# Patient Record
Sex: Female | Born: 1980
Health system: Southern US, Community
[De-identification: ages and names within clinical notes are randomized; demographics above are authoritative.]

## PROBLEM LIST (undated history)

## (undated) HISTORY — PX: AUGMENTATION MAMMAPLASTY: SUR837

---

## 2004-05-03 HISTORY — PX: PLACEMENT OF BREAST IMPLANTS: SHX6334

## 2018-07-18 ENCOUNTER — Ambulatory Visit: Payer: Self-pay | Admitting: Internal Medicine

## 2018-07-25 ENCOUNTER — Ambulatory Visit: Payer: BLUE CROSS/BLUE SHIELD | Admitting: Internal Medicine

## 2018-07-25 ENCOUNTER — Encounter: Payer: Self-pay | Admitting: Internal Medicine

## 2018-07-25 VITALS — BP 120/80 | HR 66 | Temp 98.1°F | Ht 68.0 in | Wt 161.4 lb

## 2018-07-25 DIAGNOSIS — R51 Headache: Secondary | ICD-10-CM | POA: Diagnosis not present

## 2018-07-25 DIAGNOSIS — Z8249 Family history of ischemic heart disease and other diseases of the circulatory system: Secondary | ICD-10-CM

## 2018-07-25 DIAGNOSIS — Z0000 Encounter for general adult medical examination without abnormal findings: Secondary | ICD-10-CM

## 2018-07-25 DIAGNOSIS — R519 Headache, unspecified: Secondary | ICD-10-CM

## 2018-07-25 DIAGNOSIS — H66002 Acute suppurative otitis media without spontaneous rupture of ear drum, left ear: Secondary | ICD-10-CM | POA: Diagnosis not present

## 2018-07-25 MED ORDER — AMOXICILLIN-POT CLAVULANATE 875-125 MG PO TABS: 1.0000 | ORAL_TABLET | Freq: Two times a day (BID) | ORAL | 0 refills | Status: AC

## 2018-07-25 NOTE — Patient Instructions (Addendum)
-Nice meeting you today!  -Schedule a lab appointment for next week. Come in fasting.  -Start taking augmentin 1 tablet twice daily for 7 days.  -follow up with GYN.  -Schedule mammogram with GYN.  -Make sure you have routine eye and dental care.  -Schedule return visit in 1 year for physical or as needed.   Otitis Media, Adult  Otitis media occurs when there is inflammation and fluid in the middle ear. Your middle ear is a part of the ear that contains bones for hearing as well as air that helps send sounds to your brain. What are the causes? This condition is caused by a blockage in the eustachian tube. This tube drains fluid from the ear to the back of the nose (nasopharynx). A blockage in this tube can be caused by an object or by swelling (edema) in the tube. Problems that can cause a blockage include:  A cold or other upper respiratory infection.  Allergies.  An irritant, such as tobacco smoke.  Enlarged adenoids. The adenoids are areas of soft tissue located high in the back of the throat, behind the nose and the roof of the mouth.  A mass in the nasopharynx.  Damage to the ear caused by pressure changes (barotrauma). What are the signs or symptoms? Symptoms of this condition include:  Ear pain.  A fever.  Decreased hearing.  A headache.  Tiredness (lethargy).  Fluid leaking from the ear.  Ringing in the ear. How is this diagnosed? This condition is diagnosed with a physical exam. During the exam your health care provider will use an instrument called an otoscope to look into your ear and check for redness, swelling, and fluid. He or she will also ask about your symptoms. Your health care provider may also order tests, such as:  A test to check the movement of the eardrum (pneumatic otoscopy). This test is done by squeezing a small amount of air into the ear.  A test that changes air pressure in the middle ear to check how well the eardrum moves and  whether the eustachian tube is working (tympanogram). How is this treated? This condition usually goes away on its own within 3-5 days. But if the condition is caused by a bacteria infection and does not go away own its own, or keeps coming back, your health care provider may:  Prescribe antibiotic medicines to treat the infection.  Prescribe or recommend medicines to control pain. Follow these instructions at home:  Take over-the-counter and prescription medicines only as told by your health care provider.  If you were prescribed an antibiotic medicine, take it as told by your health care provider. Do not stop taking the antibiotic even if you start to feel better.  Keep all follow-up visits as told by your health care provider. This is important. Contact a health care provider if:  You have bleeding from your nose.  There is a lump on your neck.  You are not getting better in 5 days.  You feel worse instead of better. Get help right away if:  You have severe pain that is not controlled with medicine.  You have swelling, redness, or pain around your ear.  You have stiffness in your neck.  A part of your face is paralyzed.  The bone behind your ear (mastoid) is tender when you touch it.  You develop a severe headache. Summary  Otitis media is redness, soreness, and swelling of the middle ear.  This condition usually goes  away on its own within 3-5 days.  If the problem does not go away in 3-5 days, your health care provider may prescribe or recommend medicines to treat your symptoms.  If you were prescribed an antibiotic medicine, take it as told by your health care provider. This information is not intended to replace advice given to you by your health care provider. Make sure you discuss any questions you have with your health care provider. Document Released: 03/02/2004 Document Revised: 05/18/2016 Document Reviewed: 05/18/2016   Preventive Care 18-39 Years,  Female Preventive care refers to lifestyle choices and visits with your health care provider that can promote health and wellness. What does preventive care include?   A yearly physical exam. This is also called an annual well check.  Dental exams once or twice a year.  Routine eye exams. Ask your health care provider how often you should have your eyes checked.  Personal lifestyle choices, including: ? Daily care of your teeth and gums. ? Regular physical activity. ? Eating a healthy diet. ? Avoiding tobacco and drug use. ? Limiting alcohol use. ? Practicing safe sex. ? Taking vitamin and mineral supplements as recommended by your health care provider. What happens during an annual well check? The services and screenings done by your health care provider during your annual well check will depend on your age, overall health, lifestyle risk factors, and family history of disease. Counseling Your health care provider may ask you questions about your:  Alcohol use.  Tobacco use.  Drug use.  Emotional well-being.  Home and relationship well-being.  Sexual activity.  Eating habits.  Work and work Statistician.  Method of birth control.  Menstrual cycle.  Pregnancy history. Screening You may have the following tests or measurements:  Height, weight, and BMI.  Diabetes screening. This is done by checking your blood sugar (glucose) after you have not eaten for a while (fasting).  Blood pressure.  Lipid and cholesterol levels. These may be checked every 5 years starting at age 84.  Skin check.  Hepatitis C blood test.  Hepatitis B blood test.  Sexually transmitted disease (STD) testing.  BRCA-related cancer screening. This may be done if you have a family history of breast, ovarian, tubal, or peritoneal cancers.  Pelvic exam and Pap test. This may be done every 3 years starting at age 63. Starting at age 34, this may be done every 5 years if you have a Pap test  in combination with an HPV test. Discuss your test results, treatment options, and if necessary, the need for more tests with your health care provider. Vaccines Your health care provider may recommend certain vaccines, such as:  Influenza vaccine. This is recommended every year.  Tetanus, diphtheria, and acellular pertussis (Tdap, Td) vaccine. You may need a Td booster every 10 years.  Varicella vaccine. You may need this if you have not been vaccinated.  HPV vaccine. If you are 40 or younger, you may need three doses over 6 months.  Measles, mumps, and rubella (MMR) vaccine. You may need at least one dose of MMR. You may also need a second dose.  Pneumococcal 13-valent conjugate (PCV13) vaccine. You may need this if you have certain conditions and were not previously vaccinated.  Pneumococcal polysaccharide (PPSV23) vaccine. You may need one or two doses if you smoke cigarettes or if you have certain conditions.  Meningococcal vaccine. One dose is recommended if you are age 19-21 years and a first-year college student living in a  residence hall, or if you have one of several medical conditions. You may also need additional booster doses.  Hepatitis A vaccine. You may need this if you have certain conditions or if you travel or work in places where you may be exposed to hepatitis A.  Hepatitis B vaccine. You may need this if you have certain conditions or if you travel or work in places where you may be exposed to hepatitis B.  Haemophilus influenzae type b (Hib) vaccine. You may need this if you have certain risk factors. Talk to your health care provider about which screenings and vaccines you need and how often you need them. This information is not intended to replace advice given to you by your health care provider. Make sure you discuss any questions you have with your health care provider. Document Released: 07/24/2001 Document Revised: 01/08/2017 Document Reviewed:  03/29/2015 Elsevier Interactive Patient Education  2019 [employer].  Chartered certified accountant Patient Education  [employer].

## 2018-07-25 NOTE — Progress Notes (Signed)
New Patient Office Visit     CC/Reason for Visit: Establish care, headache, left ear pain  HPI: Mary Petersen is a 38 y.o. female who is coming in today for the above mentioned reasons.  She has no past medical history of significance.  Over the weekend she had the stomach flu with nausea and vomiting.  Subsequently developed headache, has been having left ear fullness since then.  No fevers, has had chills, no sick contacts, no recent travel.  She works at a [employer] risk [employer].  She has routine dental care, has not seen an eye doctor recently.  Mother has breast cancer and also melanoma.  She has never had a mammogram but is scheduled to see GYN next month for the first time.  She is not interested in receiving flu vaccination today, is considering getting tetanus.   Past Medical/Surgical History: History reviewed. No pertinent past medical history.  History reviewed. No pertinent surgical history.  Social History:  reports that she has never smoked. She has never used smokeless tobacco. She reports current alcohol use. She reports that she does not use drugs.  Allergies: Allergies  Allergen Reactions  . Sulfa Antibiotics     Swelling with hives    Family History:  Family History  Problem Relation Age of Onset  . Breast cancer Mother   . Melanoma Mother      Current Outpatient Medications:  .  amoxicillin-clavulanate (AUGMENTIN) 875-125 MG tablet, Take 1 tablet by mouth 2 (two) times daily for 7 days., Disp: 14 tablet, Rfl: 0  Review of Systems:  Constitutional: diaphoresis, appetite change and fatigue.  HEENT: Denies photophobia, eye pain, redness, hearing loss, ear pain, congestion, sore throat, rhinorrhea, sneezing, mouth sores, trouble swallowing, neck pain, neck stiffness and tinnitus.   Respiratory: Denies SOB, DOE, cough, chest tightness,  and wheezing.   Cardiovascular: Denies chest pain, palpitations and leg swelling.  Gastrointestinal:  Denies nausea, vomiting, abdominal pain, diarrhea, constipation, blood in stool and abdominal distention.  Genitourinary: Denies dysuria, urgency, frequency, hematuria, flank pain and difficulty urinating.  Endocrine: Denies: hot or cold intolerance, sweats, changes in hair or nails, polyuria, polydipsia. Musculoskeletal: Denies myalgias, back pain, joint swelling, arthralgias and gait problem.  Skin: Denies pallor, rash and wound.  Neurological: Denies dizziness, seizures, syncope, weakness, light-headedness, numbness and headaches.  Hematological: Denies adenopathy. Easy bruising, personal or family bleeding history  Psychiatric/Behavioral: Denies suicidal ideation, mood changes, confusion, nervousness, sleep disturbance and agitation    Physical Exam: Vitals:   07/25/18 1347  BP: 120/80  Pulse: 66  Temp: 98.1 F (36.7 C)  TempSrc: Oral  SpO2: 98%  Weight: 161 lb 6.4 oz (73.2 kg)  Height: _0  (1.727 m)    Body mass index is 24.54 kg/m.   Constitutional: NAD, calm, comfortable Eyes: PERRL, lids and conjunctivae normal ENMT: Mucous membranes are moist. Posterior pharynx clear of any exudate or lesions. Normal dentition. Tympanic membrane is pearly white, no erythema or bulging on the right, on the left tympanic membrane is bulging, erythematous with air-fluid levels. Neck: normal, supple, no masses, no thyromegaly Respiratory: clear to auscultation bilaterally, no wheezing, no crackles. Normal respiratory effort. No accessory muscle use.  Cardiovascular: Regular rate and rhythm, no murmurs / rubs / gallops. No extremity edema. 2+ pedal pulses. No carotid bruits.  Abdomen: no tenderness, no masses palpated. No hepatosplenomegaly. Bowel sounds positive.  Musculoskeletal: no clubbing / cyanosis. No joint deformity upper and lower extremities. Good ROM, no contractures. Normal muscle  tone.  Skin: no rashes, lesions, ulcers. No induration Neurologic: CN 2-12 grossly intact.  Sensation intact, DTR normal. Strength 5/5 in all 4.  Psychiatric: Normal judgment and insight. Alert and oriented x 3. Normal mood.    Impression and Plan:  Non-recurrent acute suppurative otitis media of left ear without spontaneous rupture of tympanic membrane  Sinus headache -Likely has had a recent upper respiratory that has culminated in a left ear infection.  Given findings on exam, will treat as bacterial with Augmentin for 7 days. -She is instructed to return to clinic if no improvement within 10 to 14 days.  Encounter for preventive health examination -We will be seeing GYN soon for cervical cancer screening, have recommended mammogram given for mother with breast cancer. -Have advised routine eye and dental care. -She refuses flu vaccine, will think about tetanus vaccination, she does not think she has gotten it in the past 10 years. -Lab work today.     Patient Instructions  -Nice meeting you today!  -Schedule a lab appointment for next week. Come in fasting.  -Start taking augmentin 1 tablet twice daily for 7 days.  -follow up with GYN.  -Schedule mammogram with GYN.  -Make sure you have routine eye and dental care.  -Schedule return visit in 1 year for physical or as needed.   Otitis Media, Adult  Otitis media occurs when there is inflammation and fluid in the middle ear. Your middle ear is a part of the ear that contains bones for hearing as well as air that helps send sounds to your brain. What are the causes? This condition is caused by a blockage in the eustachian tube. This tube drains fluid from the ear to the back of the nose (nasopharynx). A blockage in this tube can be caused by an object or by swelling (edema) in the tube. Problems that can cause a blockage include:  A cold or other upper respiratory infection.  Allergies.  An irritant, such as tobacco smoke.  Enlarged adenoids. The adenoids are areas of soft tissue located high in the back of  the throat, behind the nose and the roof of the mouth.  A mass in the nasopharynx.  Damage to the ear caused by pressure changes (barotrauma). What are the signs or symptoms? Symptoms of this condition include:  Ear pain.  A fever.  Decreased hearing.  A headache.  Tiredness (lethargy).  Fluid leaking from the ear.  Ringing in the ear. How is this diagnosed? This condition is diagnosed with a physical exam. During the exam your health care provider will use an instrument called an otoscope to look into your ear and check for redness, swelling, and fluid. He or she will also ask about your symptoms. Your health care provider may also order tests, such as:  A test to check the movement of the eardrum (pneumatic otoscopy). This test is done by squeezing a small amount of air into the ear.  A test that changes air pressure in the middle ear to check how well the eardrum moves and whether the eustachian tube is working (tympanogram). How is this treated? This condition usually goes away on its own within 3-5 days. But if the condition is caused by a bacteria infection and does not go away own its own, or keeps coming back, your health care provider may:  Prescribe antibiotic medicines to treat the infection.  Prescribe or recommend medicines to control pain. Follow these instructions at home:  Take over-the-counter and prescription  medicines only as told by your health care provider.  If you were prescribed an antibiotic medicine, take it as told by your health care provider. Do not stop taking the antibiotic even if you start to feel better.  Keep all follow-up visits as told by your health care provider. This is important. Contact a health care provider if:  You have bleeding from your nose.  There is a lump on your neck.  You are not getting better in 5 days.  You feel worse instead of better. Get help right away if:  You have severe pain that is not controlled with  medicine.  You have swelling, redness, or pain around your ear.  You have stiffness in your neck.  A part of your face is paralyzed.  The bone behind your ear (mastoid) is tender when you touch it.  You develop a severe headache. Summary  Otitis media is redness, soreness, and swelling of the middle ear.  This condition usually goes away on its own within 3-5 days.  If the problem does not go away in 3-5 days, your health care provider may prescribe or recommend medicines to treat your symptoms.  If you were prescribed an antibiotic medicine, take it as told by your health care provider. This information is not intended to replace advice given to you by your health care provider. Make sure you discuss any questions you have with your health care provider. Document Released: 03/02/2004 Document Revised: 05/18/2016 Document Reviewed: 05/18/2016   Preventive Care 18-39 Years, Female Preventive care refers to lifestyle choices and visits with your health care provider that can promote health and wellness. What does preventive care include?   A yearly physical exam. This is also called an annual well check.  Dental exams once or twice a year.  Routine eye exams. Ask your health care provider how often you should have your eyes checked.  Personal lifestyle choices, including: ? Daily care of your teeth and gums. ? Regular physical activity. ? Eating a healthy diet. ? Avoiding tobacco and drug use. ? Limiting alcohol use. ? Practicing safe sex. ? Taking vitamin and mineral supplements as recommended by your health care provider. What happens during an annual well check? The services and screenings done by your health care provider during your annual well check will depend on your age, overall health, lifestyle risk factors, and family history of disease. Counseling Your health care provider may ask you questions about your:  Alcohol use.  Tobacco use.  Drug  use.  Emotional well-being.  Home and relationship well-being.  Sexual activity.  Eating habits.  Work and work Statistician.  Method of birth control.  Menstrual cycle.  Pregnancy history. Screening You may have the following tests or measurements:  Height, weight, and BMI.  Diabetes screening. This is done by checking your blood sugar (glucose) after you have not eaten for a while (fasting).  Blood pressure.  Lipid and cholesterol levels. These may be checked every 5 years starting at age 31.  Skin check.  Hepatitis C blood test.  Hepatitis B blood test.  Sexually transmitted disease (STD) testing.  BRCA-related cancer screening. This may be done if you have a family history of breast, ovarian, tubal, or peritoneal cancers.  Pelvic exam and Pap test. This may be done every 3 years starting at age 57. Starting at age 30, this may be done every 5 years if you have a Pap test in combination with an HPV test. Discuss your test  results, treatment options, and if necessary, the need for more tests with your health care provider. Vaccines Your health care provider may recommend certain vaccines, such as:  Influenza vaccine. This is recommended every year.  Tetanus, diphtheria, and acellular pertussis (Tdap, Td) vaccine. You may need a Td booster every 10 years.  Varicella vaccine. You may need this if you have not been vaccinated.  HPV vaccine. If you are 75 or younger, you may need three doses over 6 months.  Measles, mumps, and rubella (MMR) vaccine. You may need at least one dose of MMR. You may also need a second dose.  Pneumococcal 13-valent conjugate (PCV13) vaccine. You may need this if you have certain conditions and were not previously vaccinated.  Pneumococcal polysaccharide (PPSV23) vaccine. You may need one or two doses if you smoke cigarettes or if you have certain conditions.  Meningococcal vaccine. One dose is recommended if you are age 5-21 years  and a first-year college student living in a residence hall, or if you have one of several medical conditions. You may also need additional booster doses.  Hepatitis A vaccine. You may need this if you have certain conditions or if you travel or work in places where you may be exposed to hepatitis A.  Hepatitis B vaccine. You may need this if you have certain conditions or if you travel or work in places where you may be exposed to hepatitis B.  Haemophilus influenzae type b (Hib) vaccine. You may need this if you have certain risk factors. Talk to your health care provider about which screenings and vaccines you need and how often you need them. This information is not intended to replace advice given to you by your health care provider. Make sure you discuss any questions you have with your health care provider. Document Released: 07/24/2001 Document Revised: 01/08/2017 Document Reviewed: 03/29/2015 Elsevier Interactive Patient Education  2019 [employer].  Chartered certified accountant Patient Education  2019 [place], [place] [facility]

## 2018-07-28 ENCOUNTER — Other Ambulatory Visit (INDEPENDENT_AMBULATORY_CARE_PROVIDER_SITE_OTHER): Payer: BLUE CROSS/BLUE SHIELD

## 2018-07-28 DIAGNOSIS — Z8249 Family history of ischemic heart disease and other diseases of the circulatory system: Secondary | ICD-10-CM

## 2018-07-28 LAB — CBC WITH DIFFERENTIAL/PLATELET
Basophils Absolute: 0.1 10*3/uL (ref 0.0–0.1)
Basophils Relative: 0.9 % (ref 0.0–3.0)
Eosinophils Absolute: 0.1 10*3/uL (ref 0.0–0.7)
Eosinophils Relative: 0.8 % (ref 0.0–5.0)
HCT: 38.7 % (ref 36.0–46.0)
Hemoglobin: 13.3 g/dL (ref 12.0–15.0)
LYMPHS ABS: 1.9 10*3/uL (ref 0.7–4.0)
Lymphocytes Relative: 29.6 % (ref 12.0–46.0)
MCHC: 34.3 g/dL (ref 30.0–36.0)
MCV: 95 fl (ref 78.0–100.0)
Monocytes Absolute: 0.4 10*3/uL (ref 0.1–1.0)
Monocytes Relative: 5.6 % (ref 3.0–12.0)
NEUTROS PCT: 63.1 % (ref 43.0–77.0)
Neutro Abs: 4.1 10*3/uL (ref 1.4–7.7)
Platelets: 352 10*3/uL (ref 150.0–400.0)
RBC: 4.07 Mil/uL (ref 3.87–5.11)
RDW: 12.3 % (ref 11.5–15.5)
WBC: 6.4 10*3/uL (ref 4.0–10.5)

## 2018-07-28 LAB — LIPID PANEL
Cholesterol: 118 mg/dL (ref 0–200)
HDL: 65.8 mg/dL (ref >39.00)
LDL Cholesterol: 46 mg/dL (ref 0–99)
NONHDL: 52.45
Total CHOL/HDL Ratio: 2
Triglycerides: 33 mg/dL (ref 0.0–149.0)
VLDL: 6.6 mg/dL (ref 0.0–40.0)

## 2018-07-28 LAB — COMPREHENSIVE METABOLIC PANEL
ALT: 10 U/L (ref 0–35)
AST: 17 U/L (ref 0–37)
Albumin: 4.9 g/dL (ref 3.5–5.2)
Alkaline Phosphatase: 65 U/L (ref 39–117)
BUN: 12 mg/dL (ref 6–23)
CO2: 28 mEq/L (ref 19–32)
Calcium: 9.9 mg/dL (ref 8.4–10.5)
Chloride: 101 mEq/L (ref 96–112)
Creatinine, Ser: 0.8 mg/dL (ref 0.40–1.20)
GFR: 80.97 mL/min (ref >60.00)
Glucose, Bld: 85 mg/dL (ref 70–99)
Potassium: 4 mEq/L (ref 3.5–5.1)
Sodium: 138 mEq/L (ref 135–145)
TOTAL PROTEIN: 6.7 g/dL (ref 6.0–8.3)
Total Bilirubin: 0.7 mg/dL (ref 0.2–1.2)

## 2018-07-29 LAB — TSH: TSH: 2.69 u[IU]/mL (ref 0.35–4.50)

## 2019-01-27 ENCOUNTER — Other Ambulatory Visit: Payer: Self-pay

## 2019-01-27 ENCOUNTER — Other Ambulatory Visit: Payer: Self-pay | Admitting: Chiropractic Medicine

## 2019-01-27 ENCOUNTER — Ambulatory Visit
Admission: RE | Admit: 2019-01-27 | Discharge: 2019-01-27 | Disposition: A | Discharge status: Home / Self Care | Payer: BLUE CROSS/BLUE SHIELD | Source: Ambulatory Visit | Attending: Chiropractic Medicine | Admitting: Chiropractic Medicine

## 2019-01-27 DIAGNOSIS — M25561 Pain in right knee: Secondary | ICD-10-CM

## 2019-04-14 ENCOUNTER — Telehealth: Payer: Self-pay | Admitting: *Deleted

## 2019-04-14 DIAGNOSIS — R300 Dysuria: Secondary | ICD-10-CM

## 2019-04-14 NOTE — Telephone Encounter (Signed)
I have no concerns with a low creatinine, she can drop off a UA tho if she is concerned.

## 2019-04-14 NOTE — Telephone Encounter (Signed)
Copied from [place] [id number]. Topic: General - Inquiry >> Apr 13, 2019  2:31 PM Alanda Slim E wrote: Reason for CRM: Pt had urine sample done for life insurance. And the urine came back with low levels of Kreatinin and Globulin was a little high and the Pt would like to come in and have another urine sample done at the office / please advise

## 2019-04-15 NOTE — Telephone Encounter (Signed)
Left detailed message on machine for patient to return our call Lab ordered CRM

## 2019-04-15 NOTE — Telephone Encounter (Signed)
Patient is aware and lab appointment scheduled 

## 2019-04-17 ENCOUNTER — Other Ambulatory Visit: Payer: Self-pay

## 2019-04-17 ENCOUNTER — Other Ambulatory Visit (INDEPENDENT_AMBULATORY_CARE_PROVIDER_SITE_OTHER): Payer: BC Managed Care – PPO

## 2019-04-17 ENCOUNTER — Encounter: Payer: Self-pay | Admitting: Internal Medicine

## 2019-04-17 DIAGNOSIS — R300 Dysuria: Secondary | ICD-10-CM | POA: Diagnosis not present

## 2019-04-17 LAB — POCT URINALYSIS DIPSTICK
Glucose, UA: NEGATIVE
Leukocytes, UA: NEGATIVE
Protein, UA: NEGATIVE
Spec Grav, UA: 1.015 (ref 1.010–1.025)
Urobilinogen, UA: 0.2 E.U./dL
pH, UA: 6.5 (ref 5.0–8.0)

## 2019-07-13 ENCOUNTER — Encounter: Payer: Self-pay | Admitting: Internal Medicine

## 2019-07-13 ENCOUNTER — Other Ambulatory Visit: Payer: Self-pay

## 2019-07-13 ENCOUNTER — Telehealth (INDEPENDENT_AMBULATORY_CARE_PROVIDER_SITE_OTHER): Payer: BC Managed Care – PPO | Admitting: Internal Medicine

## 2019-07-13 DIAGNOSIS — H9201 Otalgia, right ear: Secondary | ICD-10-CM

## 2019-07-13 DIAGNOSIS — Z9622 Myringotomy tube(s) status: Secondary | ICD-10-CM

## 2019-07-13 DIAGNOSIS — R519 Headache, unspecified: Secondary | ICD-10-CM | POA: Diagnosis not present

## 2019-07-13 MED ORDER — AMOXICILLIN 500 MG PO CAPS: 500.0000 mg | ORAL_CAPSULE | Freq: Three times a day (TID) | ORAL | 0 refills | Status: AC

## 2019-07-13 NOTE — Progress Notes (Signed)
Virtual Visit via Video Note  I connected with@ on 07/13/19 at 11:00 AM EST by a video enabled telemedicine application and verified that I am speaking with the correct person using two identifiers. Location patient: home Location provider:work  office Persons participating in the virtual visit: patient, provider  [place]th national recommendations  regarding COVID 19 pandemic   video visit is advised over in office visit for this patient.  Patient aware  of the limitations of evaluation and management by telemedicine and  Dec availability of in person appointments. and agreed to proceed. An l,imtation for covid infection exposures   HPI: Mary Petersen presents for video visit Onset 2 days  go of HA  Some nausea  No migraine vision of sig cold sx.   With bilateral ear pain  Had one day of chills but no fever by documentation.   Now has right ear pain constant similar to pain from last year no sinus pain ST cough or drainage  Some pain with chew? No swelling     Has taken ibuprofen with   Modest help.  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   Hx of PE tubes as a young child   No obv  Hearing changes  ROS: See pertinent positives and negatives per HPI.  History reviewed. No pertinent past medical history.  History reviewed. No pertinent surgical history.  Family History  Problem Relation Age of Onset  . Breast cancer Mother   . Melanoma Mother     Social History   Tobacco Use  . Smoking status: Never Smoker  . Smokeless tobacco: Never Used  Substance Use Topics  . Alcohol use: Yes  . Drug use: Never      Current Outpatient Medications:  .  amoxicillin (AMOXIL) 500 MG capsule, Take 1 capsule (500 mg total) by mouth 3 (three) times daily for 5 days., Disp: 15 capsule, Rfl: 0  EXAM: BP Readings from Last 3 Encounters:  07/25/18 120/80    VITALS per patient if applicable:  GENERAL: alert, oriented, appears well and in no  acute distress  HEENT: atraumatic, conjunttiva clear, no obvious abnormalities on inspection of external nose and ears  NECK: normal movements of the head and neck  LUNGS: on inspection no signs of respiratory distress, breathing rate appears normal, no obvious gross SOB, gasping or wheezing  CV: no obvious cyanosis  MS: moves all visible extremities without noticeable abnormality  PSYCH/NEURO: pleasant and cooperative, no obvious depression or anxiety, speech and thought processing grossly intact   ASSESSMENT AND PLAN:  Discussed the following assessment and plan:    ICD-10-CM   1. Otalgia of right ear  H92.01    poss referred ? tmj sinus? but since hx of same  empiric rx offered  may get better with out med also   2. Headache, unspecified headache type  R51.9    and chills resolved  no fever   3. History of placement of ear tubes as a  yong  child  Z96.22     Counseled.  Antibiotics may  Not Change course of ear infection in adults  and this could be referred pain or som of viral cause   ( even tmj) but since had a day of HA and chills advise  sars cov2 testing  Disc limitation of video   Expectant management and discussion of plan and treatment with opportunity to ask questions and all were answered. The patient agreed with  the plan and demonstrated an understanding of the instructions.   Advised to call back or seek an in-person evaluation if worsening  or having  further concerns . Return if symptoms worsen or fail to improve as expected.  Berniece Andreas, MD

## 2019-08-28 ENCOUNTER — Ambulatory Visit: Payer: BC Managed Care – PPO | Attending: Internal Medicine

## 2019-08-28 DIAGNOSIS — Z23 Encounter for immunization: Secondary | ICD-10-CM

## 2019-08-28 NOTE — Progress Notes (Signed)
   Covid-19 Vaccination Clinic  Name:  Karessa Onorato    MRN: [id number] DOB: [birth year]  08/28/2019  Ms. Erxleben was observed post Covid-19 immunization for 15 minutes without incident. She was provided with Vaccine Information Sheet and instruction to access the V-Safe system.   Ms. Boerner was instructed to call 911 with any severe reactions post vaccine: Marland Kitchen Difficulty breathing  . Swelling of face and throat  . A fast heartbeat  . A bad rash all over body  . Dizziness and weakness   Immunizations Administered    Name Date Dose VIS Date Route   Pfizer COVID-19 Vaccine 08/28/2019  3:00 PM 0.3 mL 05/22/2019 Intramuscular   Manufacturer: [employer], [employer]   Lot: [id number]   NDC: [address]-6

## 2019-09-23 ENCOUNTER — Ambulatory Visit: Payer: BC Managed Care – PPO | Attending: Internal Medicine

## 2019-09-23 DIAGNOSIS — Z23 Encounter for immunization: Secondary | ICD-10-CM

## 2019-09-23 NOTE — Progress Notes (Signed)
   Covid-19 Vaccination Clinic  Name:  Mary Petersen    MRN: [id number] DOB: 01-10-1981  09/23/2019  Mary Petersen was observed post Covid-19 immunization for 15 minutes without incident. She was provided with Vaccine Information Sheet and instruction to access the V-Safe system.   Mary Petersen was instructed to call 911 with any severe reactions post vaccine: Marland Kitchen Difficulty breathing  . Swelling of face and throat  . A fast heartbeat  . A bad rash all over body  . Dizziness and weakness   Immunizations Administered    Name Date Dose VIS Date Route   Pfizer COVID-19 Vaccine 09/23/2019 11:28 AM 0.3 mL 05/22/2019 Intramuscular   Manufacturer: [employer], [employer]   Lot: [id number]   NDC: [address]-6

## 2019-12-15 ENCOUNTER — Ambulatory Visit
Admission: RE | Admit: 2019-12-15 | Discharge: 2019-12-15 | Disposition: A | Discharge status: Home / Self Care | Payer: BC Managed Care – PPO | Source: Ambulatory Visit | Attending: Physician Assistant | Admitting: Physician Assistant

## 2019-12-15 ENCOUNTER — Other Ambulatory Visit: Payer: Self-pay

## 2019-12-15 ENCOUNTER — Ambulatory Visit
Admission: RE | Admit: 2019-12-15 | Discharge: 2019-12-15 | Discharge status: Absent without leave | Payer: BC Managed Care – PPO | Source: Ambulatory Visit

## 2019-12-15 VITALS — BP 161/94 | HR 63 | Temp 98.1°F | Resp 18

## 2019-12-15 DIAGNOSIS — Z5189 Encounter for other specified aftercare: Secondary | ICD-10-CM | POA: Diagnosis not present

## 2019-12-15 DIAGNOSIS — T63441A Toxic effect of venom of bees, accidental (unintentional), initial encounter: Secondary | ICD-10-CM

## 2019-12-15 MED ORDER — TRIAMCINOLONE ACETONIDE 0.5 % EX CREA: 1.0000 "application " | TOPICAL_CREAM | Freq: Three times a day (TID) | CUTANEOUS | 0 refills | Status: DC

## 2019-12-15 NOTE — ED Provider Notes (Signed)
[facility]    CSN: [id number] Arrival date & time: 12/15/19  1851      History   Chief Complaint Chief Complaint  Patient presents with  . Insect Bite    HPI Mary Petersen is a 39 y.o. female.   39 year old female comes in for wound recheck after being stung by yellow jacket 4 days ago.  Was mushroom hunting, and dogs ran into a yellowjacket hive.  Was stung once on left extensor surface of forearm. Area itching in sensation, now also with pain. Has redness surrounding site without spreading. Denies fever. Benadryl, ibuprofen with some relief.     History reviewed. No pertinent past medical history.  There are no problems to display for this patient.   History reviewed. No pertinent surgical history.  OB History   No obstetric history on file.      Home Medications    Prior to Admission medications   Medication Sig Start Date End Date Taking? Authorizing Provider  triamcinolone cream (KENALOG) 0.5 % Apply 1 application topically 3 (three) times daily. 12/15/19   Belinda Fisher, PA-C    Family History Family History  Problem Relation Age of Onset  . Breast cancer Mother   . Melanoma Mother     Social History Social History   Tobacco Use  . Smoking status: Never Smoker  . Smokeless tobacco: Never Used  Vaping Use  . Vaping Use: Never used  Substance Use Topics  . Alcohol use: Yes  . Drug use: Never     Allergies   Sulfa antibiotics   Review of Systems Review of Systems  Reason unable to perform ROS: See HPI as above.     Physical Exam Triage Vital Signs ED Triage Vitals  Enc Vitals Group     BP 12/15/19 1903 (!) 161/94     Pulse Rate 12/15/19 1903 63     Resp 12/15/19 1903 18     Temp 12/15/19 1903 98.1 F (36.7 C)     Temp Source 12/15/19 1903 Oral     SpO2 12/15/19 1903 97 %     Weight --      Height --      Head Circumference --      Peak Flow --      Pain Score 12/15/19 1904 4     Pain Loc --      Pain Edu? --       Excl. in GC? --    No data found.  Updated Vital Signs BP (!) 161/94 (BP Location: Left Arm)   Pulse 63   Temp 98.1 F (36.7 C) (Oral)   Resp 18   LMP 12/13/2019   SpO2 97%   Physical Exam Constitutional:      General: She is not in acute distress.    Appearance: Normal appearance. She is well-developed. She is not toxic-appearing or diaphoretic.  HENT:     Head: Normocephalic and atraumatic.  Eyes:     Conjunctiva/sclera: Conjunctivae normal.     Pupils: Pupils are equal, round, and reactive to light.  Pulmonary:     Effort: Pulmonary effort is normal. No respiratory distress.     Comments: Speaking in full sentences without difficulty Musculoskeletal:     Cervical back: Normal range of motion and neck supple.  Skin:    General: Skin is warm and dry.     Comments: Round erythema with maculopapular rash to the mid left forearm of extensor surface. approx 3cm x  2cm. No warmth, induration, fluctuance. No tenderness to palpation. No streaking.   Neurological:     Mental Status: She is alert and oriented to person, place, and time.      UC Treatments / Results  Labs (all labs ordered are listed, but only abnormal results are displayed) Labs Reviewed - No data to display  EKG   Radiology No results found.  Procedures Procedures (including critical care time)  Medications Ordered in UC Medications - No data to display  Initial Impression / Assessment and Plan / UC Course  I have reviewed the triage vital signs and the nursing notes.  Pertinent labs & imaging results that were available during my care of the patient were reviewed by me and considered in my medical decision making (see chart for details).    Triamcinolone as directed. Continue other symptomatic management. Expected course of healing discussed. Return precautions given.  Final Clinical Impressions(s) / UC Diagnoses   Final diagnoses:  Visit for wound check  Bee sting, accidental or  unintentional, initial encounter    ED Prescriptions    Medication Sig Dispense Auth. Provider   triamcinolone cream (KENALOG) 0.5 % Apply 1 application topically 3 (three) times daily. 30 g Belinda Fisher, PA-C     PDMP not reviewed this encounter.   Belinda Fisher, PA-C 12/15/19 1919

## 2019-12-15 NOTE — ED Triage Notes (Signed)
Pt states stung by a yellow jacket on Saturday to lt posterior forearm. Pt c/o pain and burning sensation to lt arm, red area noted.

## 2019-12-15 NOTE — ED Provider Notes (Signed)
Patient unable to connect for video visit.    Mickie Bail, NP 12/15/19 1215

## 2019-12-15 NOTE — Discharge Instructions (Signed)
Start triamcinolone as directed. Continue ice compress, ibuprofen. Monitor for spreading redness, increased warmth, fever, follow up for reevaluation.

## 2020-07-30 IMAGING — CR RIGHT KNEE - COMPLETE 4+ VIEW
4 series · 4 of 4 positions shown · non-contrast
Comparison: None.

CLINICAL DATA: Right knee pain

EXAM:
RIGHT KNEE - COMPLETE 4+ VIEW

[w knee ap right]
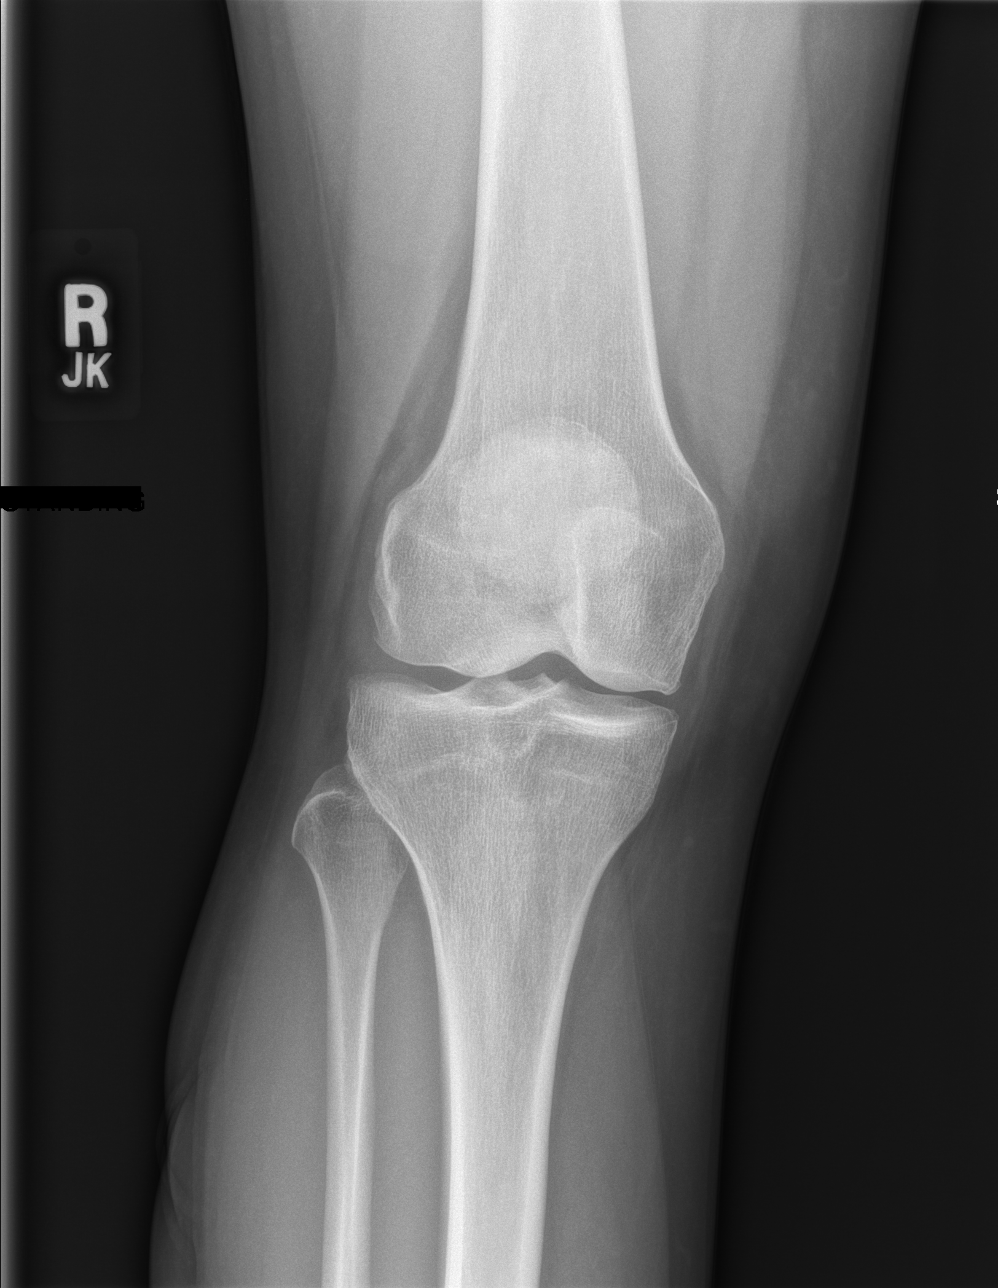

[w knee lat right (1 of 2)]
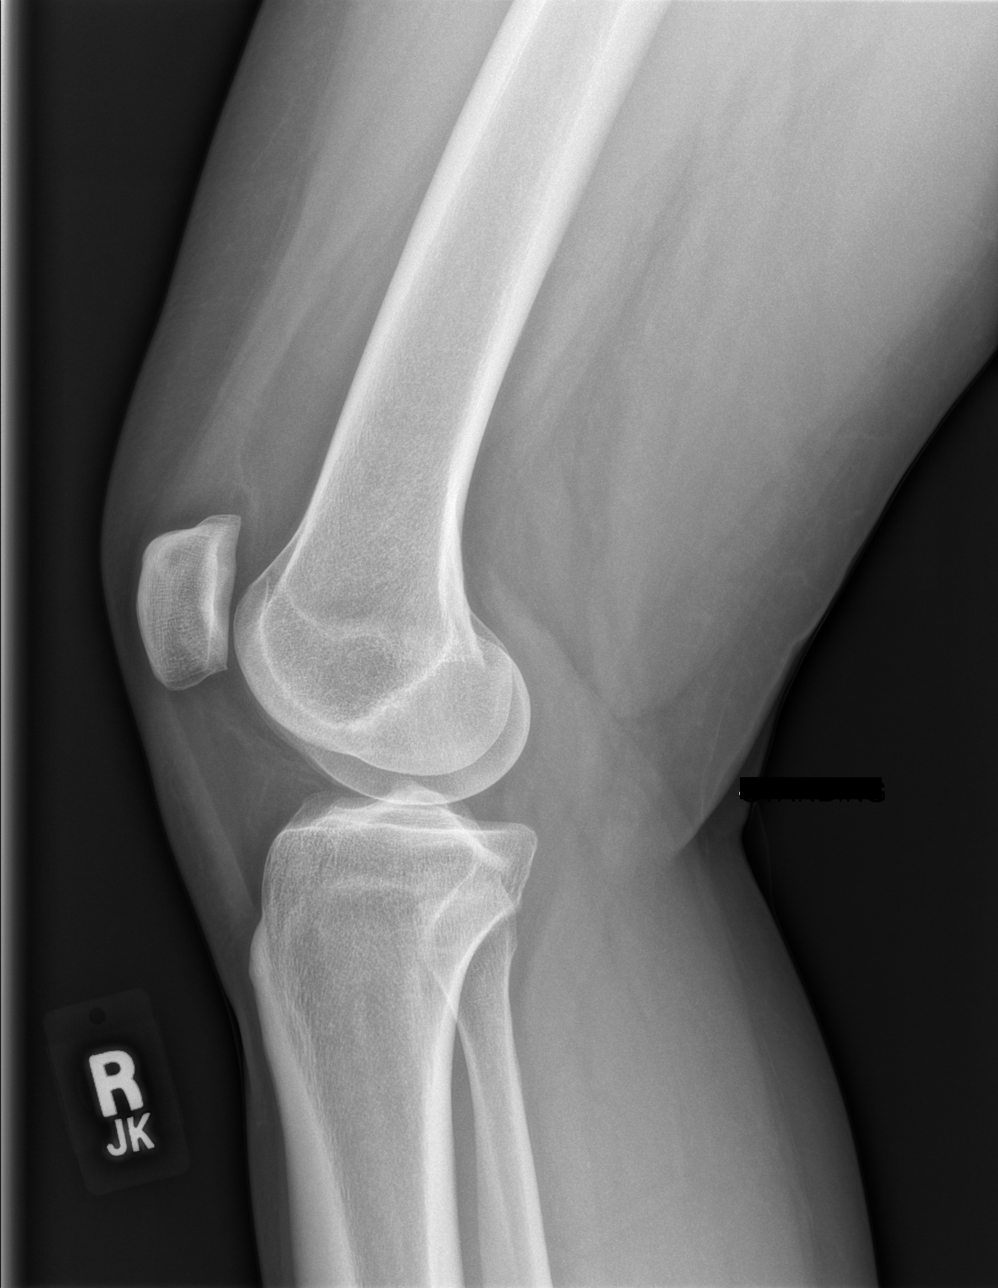

[w knee lat right (2 of 2)]
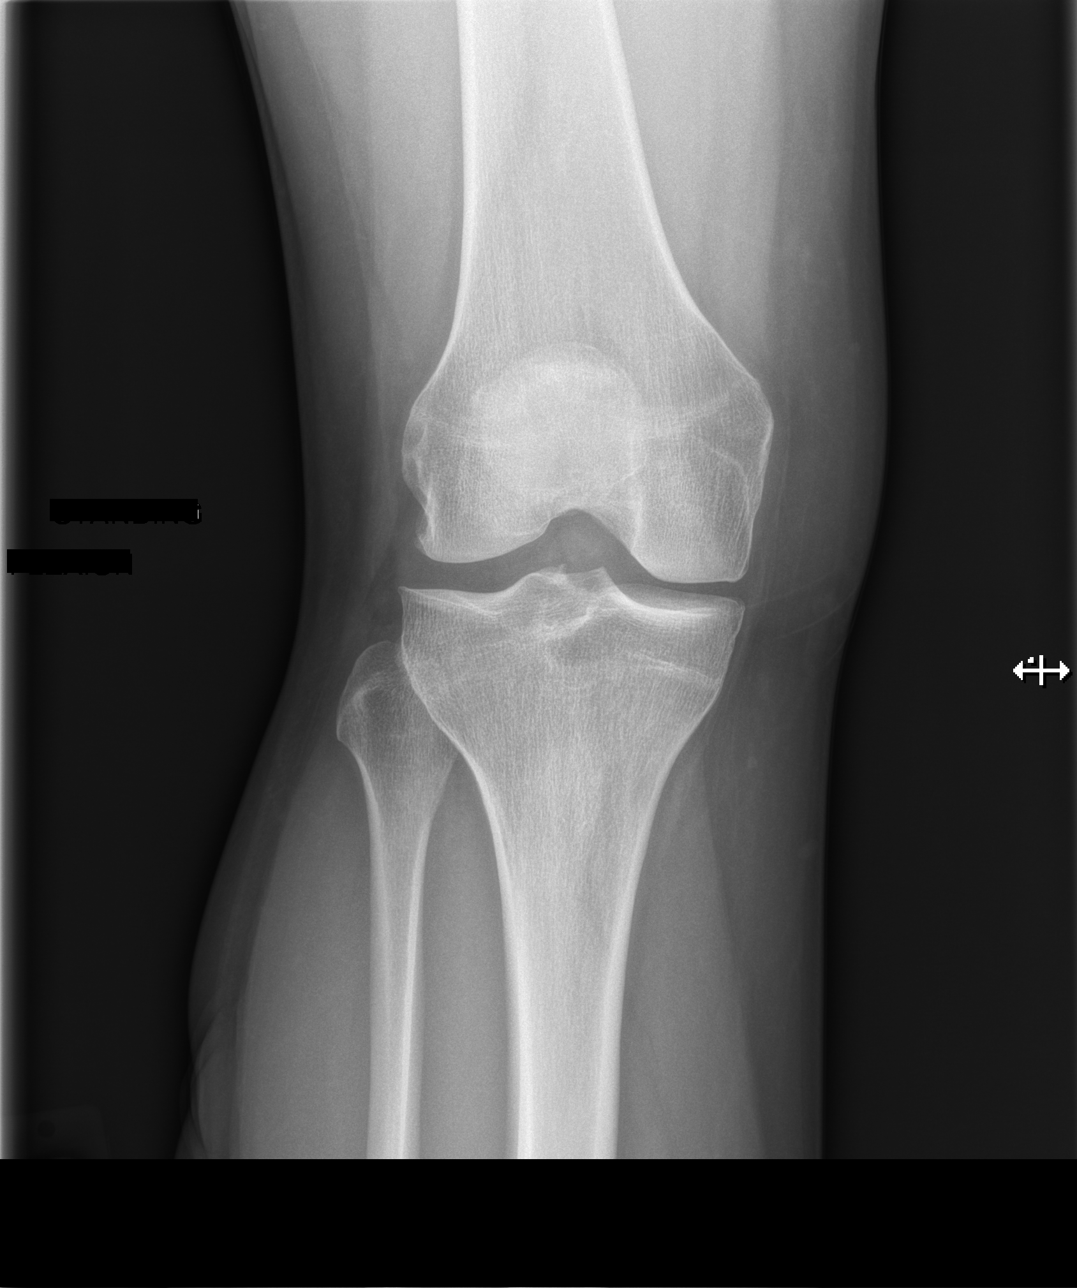

[x knee sunrise right]
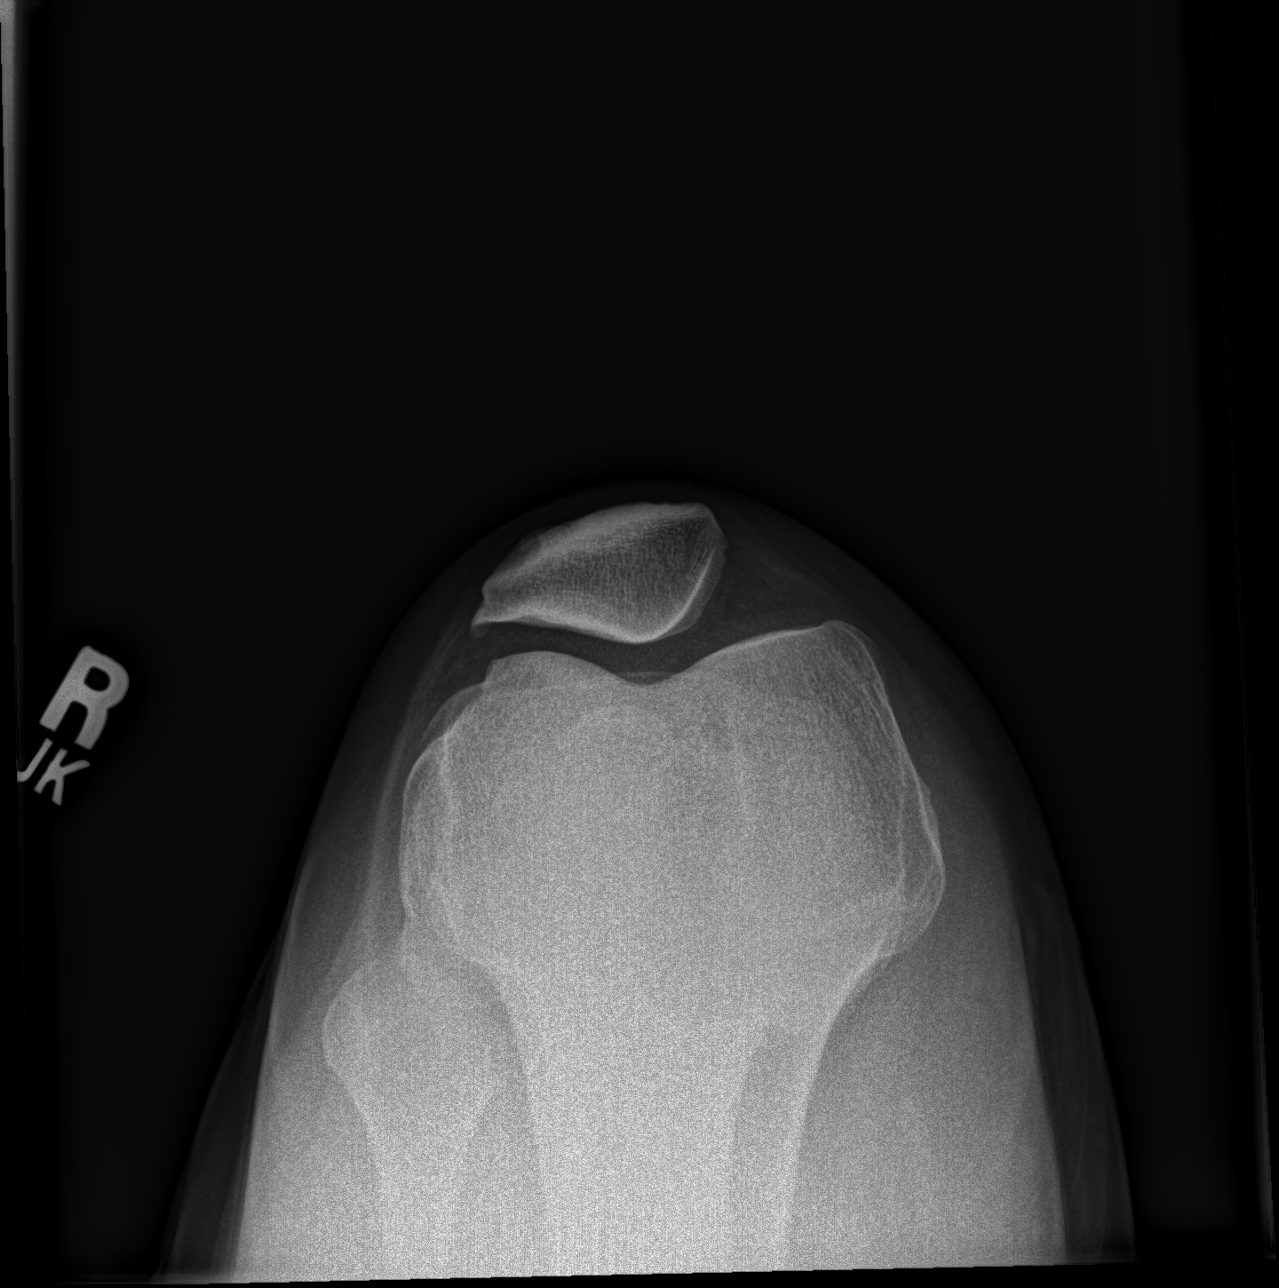

[4 of 4 positions shown; findings below may reference images not displayed]

FINDINGS: There is no acute displaced fracture or dislocation. There are mild
degenerative changes, greatest within the medial compartment. There
is no significant joint effusion.
IMPRESSION: 1. No acute displaced fracture or dislocation.
2. Mild degenerative changes.

## 2020-11-16 ENCOUNTER — Telehealth: Payer: BC Managed Care – PPO | Admitting: Nurse Practitioner

## 2020-11-16 DIAGNOSIS — N3000 Acute cystitis without hematuria: Secondary | ICD-10-CM

## 2020-11-16 MED ORDER — CEPHALEXIN 500 MG PO CAPS: 500.0000 mg | ORAL_CAPSULE | Freq: Two times a day (BID) | ORAL | 0 refills | Status: DC

## 2020-11-16 NOTE — Progress Notes (Signed)

## 2021-02-08 ENCOUNTER — Telehealth: Payer: Self-pay | Admitting: Obstetrics and Gynecology

## 2021-03-06 ENCOUNTER — Encounter: Payer: BC Managed Care – PPO | Admitting: Obstetrics and Gynecology

## 2021-03-08 ENCOUNTER — Encounter: Payer: BC Managed Care – PPO | Admitting: Obstetrics & Gynecology

## 2021-04-28 ENCOUNTER — Other Ambulatory Visit: Payer: Self-pay

## 2021-04-28 ENCOUNTER — Other Ambulatory Visit (HOSPITAL_COMMUNITY)
Admission: RE | Admit: 2021-04-28 | Discharge: 2021-04-28 | Disposition: A | Discharge status: Home / Self Care | Payer: BC Managed Care – PPO | Source: Ambulatory Visit | Attending: Obstetrics and Gynecology | Admitting: Obstetrics and Gynecology

## 2021-04-28 ENCOUNTER — Encounter: Payer: Self-pay | Admitting: Obstetrics & Gynecology

## 2021-04-28 ENCOUNTER — Ambulatory Visit (INDEPENDENT_AMBULATORY_CARE_PROVIDER_SITE_OTHER): Payer: BC Managed Care – PPO | Admitting: Obstetrics & Gynecology

## 2021-04-28 VITALS — BP 161/98 | HR 64 | Ht 68.0 in | Wt 171.3 lb

## 2021-04-28 DIAGNOSIS — Z01419 Encounter for gynecological examination (general) (routine) without abnormal findings: Secondary | ICD-10-CM

## 2021-04-28 NOTE — Addendum Note (Signed)
Addended by: Jaynie Collins A on: 04/28/2021 09:44 AM   Modules accepted: Orders

## 2021-04-28 NOTE — Progress Notes (Signed)
GYNECOLOGY ANNUAL PREVENTATIVE CARE ENCOUNTER NOTE  History:     Mary Petersen is a 40 y.o. G0 female here for a routine annual gynecologic exam and to establish care.  Current complaints: none.   Denies abnormal vaginal bleeding, discharge, pelvic pain, problems with intercourse or other gynecologic concerns.    Gynecologic History Patient's last menstrual period was 03/30/2021 (approximate). Contraception: none Last Pap: about five years ago. Result was normal. Denies abnormal paps  Obstetric History OB History  Gravida Para Term Preterm AB Living  0 0 0 0 0 0  SAB IAB Ectopic Multiple Live Births  0 0 0 0 0    No past medical history on file.  Past Surgical History:  Procedure Laterality Date   PLACEMENT OF BREAST IMPLANTS  05/03/2004    No current outpatient medications on file prior to visit.   No current facility-administered medications on file prior to visit.    Allergies  Allergen Reactions   Sulfa Antibiotics     Swelling with hives    Social History:  reports that she has never smoked. She has never used smokeless tobacco. She reports current alcohol use. She reports that she does not use drugs.  Family History  Problem Relation Age of Onset   Cancer Mother 63       Breast cancer - she was taking exogenous hormones. Negative testing for all mutations   Breast cancer Mother    Melanoma Mother     The following portions of the patient's history were reviewed and updated as appropriate: allergies, current medications, past family history, past medical history, past social history, past surgical history and problem list.  Review of Systems Pertinent items noted in HPI and remainder of comprehensive ROS otherwise negative.  Physical Exam:  BP (!) 161/98   Pulse 64   Ht 5\' 8"  (1.727 m)   Wt 171 lb 4.8 oz (77.7 kg)   LMP 03/30/2021 (Approximate)   BMI 26.05 kg/m  CONSTITUTIONAL: Well-developed, well-nourished female in no acute distress.   HENT:  Normocephalic, atraumatic, External right and left ear normal.  EYES: Conjunctivae and EOM are normal. Pupils are equal, round, and reactive to light. No scleral icterus.  NECK: Normal range of motion, supple, no masses.  Normal thyroid.  SKIN: Skin is warm and dry. No rash noted. Not diaphoretic. No erythema. No pallor. MUSCULOSKELETAL: Normal range of motion. No tenderness.  No cyanosis, clubbing, or edema. NEUROLOGIC: Alert and oriented to person, place, and time. Normal reflexes, muscle tone coordination.  PSYCHIATRIC: Normal mood and affect. Normal behavior. Normal judgment and thought content. CARDIOVASCULAR: Normal heart rate noted, regular rhythm RESPIRATORY: Clear to auscultation bilaterally. Effort and breath sounds normal, no problems with respiration noted. BREASTS: Symmetric in size, implants in place. No masses, tenderness, skin changes, nipple drainage, or lymphadenopathy bilaterally. Performed in the presence of a chaperone. ABDOMEN: Soft, no distention noted.  No tenderness, rebound or guarding.  PELVIC: Normal appearing external genitalia and urethral meatus; normal appearing vaginal mucosa and cervix.  No abnormal vaginal discharge noted.  Pap smear obtained.  Normal uterine size, no other palpable masses, no uterine or adnexal tenderness.  Performed in the presence of a chaperone.   Assessment and Plan:     1. Encounter for annual routine gynecological examination - Cytology - PAP( [facility]) Will follow up results of pap smear and manage accordingly. Normal breast exam, mammography to start next year. Routine preventative health maintenance measures emphasized. Please refer to After  Visit Summary for other counseling recommendations.      Verita Schneiders, MD, [place] for [employer], [place]

## 2021-05-09 ENCOUNTER — Encounter: Payer: Self-pay | Admitting: Obstetrics & Gynecology

## 2021-05-09 ENCOUNTER — Encounter: Payer: Self-pay | Admitting: *Deleted

## 2021-05-09 ENCOUNTER — Telehealth: Payer: Self-pay | Admitting: *Deleted

## 2021-05-09 DIAGNOSIS — R87612 Low grade squamous intraepithelial lesion on cytologic smear of cervix (LGSIL): Secondary | ICD-10-CM | POA: Insufficient documentation

## 2021-05-09 LAB — CYTOLOGY - PAP
Comment: NEGATIVE
Comment: NEGATIVE
HPV 16: NEGATIVE
HPV 18 / 45: NEGATIVE
High risk HPV: POSITIVE — AB

## 2021-05-09 NOTE — Telephone Encounter (Addendum)
-----   Message from Tereso Newcomer, MD sent at 05/09/2021 10:40 AM EST ----- Please schedule patient for colposcopy for LGSIL +HRHPV pap done on 04/28/2021. Please call to inform patient of results and need for appointment.  11/29  2:05  Called pt and left VM message stating that I am calling with test results and recommendation from the doctor. She may call back to the nurse voicemail or return message via Mychart.

## 2021-06-07 ENCOUNTER — Ambulatory Visit: Payer: BC Managed Care – PPO | Admitting: Internal Medicine

## 2021-06-07 ENCOUNTER — Encounter: Payer: Self-pay | Admitting: Internal Medicine

## 2021-06-07 VITALS — BP 128/88 | HR 55 | Temp 98.0°F | Ht 68.0 in | Wt 170.5 lb

## 2021-06-07 DIAGNOSIS — R87612 Low grade squamous intraepithelial lesion on cytologic smear of cervix (LGSIL): Secondary | ICD-10-CM

## 2021-06-07 DIAGNOSIS — Z01818 Encounter for other preprocedural examination: Secondary | ICD-10-CM

## 2021-06-07 LAB — COMPREHENSIVE METABOLIC PANEL
ALT: 11 U/L (ref 0–35)
AST: 22 U/L (ref 0–37)
Albumin: 4.7 g/dL (ref 3.5–5.2)
Alkaline Phosphatase: 54 U/L (ref 39–117)
BUN: 8 mg/dL (ref 6–23)
CO2: 28 mEq/L (ref 19–32)
Calcium: 9.9 mg/dL (ref 8.4–10.5)
Chloride: 102 mEq/L (ref 96–112)
Creatinine, Ser: 0.71 mg/dL (ref 0.40–1.20)
GFR: 107.21 mL/min (ref >60.00)
Glucose, Bld: 86 mg/dL (ref 70–99)
Potassium: 3.9 mEq/L (ref 3.5–5.1)
Sodium: 138 mEq/L (ref 135–145)
Total Bilirubin: 0.7 mg/dL (ref 0.2–1.2)
Total Protein: 6.9 g/dL (ref 6.0–8.3)

## 2021-06-07 LAB — CBC WITH DIFFERENTIAL/PLATELET
Basophils Absolute: 0.1 10*3/uL (ref 0.0–0.1)
Basophils Relative: 1.4 % (ref 0.0–3.0)
Eosinophils Absolute: 0.1 10*3/uL (ref 0.0–0.7)
Eosinophils Relative: 1.9 % (ref 0.0–5.0)
HCT: 36.9 % (ref 36.0–46.0)
Hemoglobin: 12.5 g/dL (ref 12.0–15.0)
Lymphocytes Relative: 30.3 % (ref 12.0–46.0)
Lymphs Abs: 1.2 10*3/uL (ref 0.7–4.0)
MCHC: 33.8 g/dL (ref 30.0–36.0)
MCV: 95.5 fl (ref 78.0–100.0)
Monocytes Absolute: 0.3 10*3/uL (ref 0.1–1.0)
Monocytes Relative: 7.8 % (ref 3.0–12.0)
Neutro Abs: 2.3 10*3/uL (ref 1.4–7.7)
Neutrophils Relative %: 58.6 % (ref 43.0–77.0)
Platelets: 308 10*3/uL (ref 150.0–400.0)
RBC: 3.86 Mil/uL — ABNORMAL LOW (ref 3.87–5.11)
RDW: 12.4 % (ref 11.5–15.5)
WBC: 3.9 10*3/uL — ABNORMAL LOW (ref 4.0–10.5)

## 2021-06-07 LAB — LIPID PANEL
Cholesterol: 148 mg/dL (ref 0–200)
HDL: 82.2 mg/dL (ref >39.00)
LDL Cholesterol: 58 mg/dL (ref 0–99)
NonHDL: 65.86
Total CHOL/HDL Ratio: 2
Triglycerides: 40 mg/dL (ref 0.0–149.0)
VLDL: 8 mg/dL (ref 0.0–40.0)

## 2021-06-07 LAB — HEMOGLOBIN A1C: Hgb A1c MFr Bld: 4.8 % (ref 4.6–6.5)

## 2021-06-07 NOTE — Progress Notes (Signed)
Established Patient Office Visit     This visit occurred during the SARS-CoV-2 public health emergency.  Safety protocols were in place, including screening questions prior to the visit, additional usage of staff PPE, and extensive cleaning of exam room while observing appropriate contact time as indicated for disinfecting solutions.    CC/Reason for Visit: Preoperative clearance  HPI: Mary Petersen is a 40 y.o. female who is coming in today for the above mentioned reasons.  She has no past medical history of significance.  In November she saw her GYN and had an LGSIL with high risk HPV and is scheduled for colposcopy in January.  She is here today for preoperative clearance.  She is having her breast implants exchanged in February.  They are requesting blood work and a medical clearance form.  She feels well and has no acute concerns or complaints.  She does not have chest pain or dyspnea on exertion.  She exercises routinely.  Past Medical/Surgical History: No past medical history on file.  Past Surgical History:  Procedure Laterality Date   PLACEMENT OF BREAST IMPLANTS  05/03/2004    Social History:  reports that she has never smoked. She has never used smokeless tobacco. She reports current alcohol use. She reports that she does not use drugs.  Allergies: Allergies  Allergen Reactions   Sulfa Antibiotics     Swelling with hives    Family History:  Family History  Problem Relation Age of Onset   Cancer Mother 73       Breast cancer - she was taking exogenous hormones. Negative testing for all mutations   Breast cancer Mother    Melanoma Mother     No current outpatient medications on file.  Review of Systems:  Constitutional: Denies fever, chills, diaphoresis, appetite change and fatigue.  HEENT: Denies photophobia, eye pain, redness, hearing loss, ear pain, congestion, sore throat, rhinorrhea, sneezing, mouth sores, trouble swallowing, neck pain, neck  stiffness and tinnitus.   Respiratory: Denies SOB, DOE, cough, chest tightness,  and wheezing.   Cardiovascular: Denies chest pain, palpitations and leg swelling.  Gastrointestinal: Denies nausea, vomiting, abdominal pain, diarrhea, constipation, blood in stool and abdominal distention.  Genitourinary: Denies dysuria, urgency, frequency, hematuria, flank pain and difficulty urinating.  Endocrine: Denies: hot or cold intolerance, sweats, changes in hair or nails, polyuria, polydipsia. Musculoskeletal: Denies myalgias, back pain, joint swelling, arthralgias and gait problem.  Skin: Denies pallor, rash and wound.  Neurological: Denies dizziness, seizures, syncope, weakness, light-headedness, numbness and headaches.  Hematological: Denies adenopathy. Easy bruising, personal or family bleeding history  Psychiatric/Behavioral: Denies suicidal ideation, mood changes, confusion, nervousness, sleep disturbance and agitation    Physical Exam: Vitals:   06/07/21 1122  BP: 128/88  Pulse: (!) 55  Temp: 98 F (36.7 C)  TempSrc: Oral  SpO2: 99%  Weight: 170 lb 8 oz (77.3 kg)  Height: 5\' 8"  (1.727 m)    Body mass index is 25.92 kg/m.   Constitutional: NAD, calm, comfortable Eyes: PERRL, lids and conjunctivae normal ENMT: Mucous membranes are moist.  Respiratory: clear to auscultation bilaterally, no wheezing, no crackles. Normal respiratory effort. No accessory muscle use.  Cardiovascular: Regular rate and rhythm, no murmurs / rubs / gallops. No extremity edema.  Neurologic: Grossly intact and nonfocal Psychiatric: Normal judgment and insight. Alert and oriented x 3. Normal mood.    Impression and Plan:  Preop examination  - Plan: CBC with Differential/Platelet, Comprehensive metabolic panel, Hemoglobin A1c, Lipid panel, Protime-INR,  APTT -Labs ordered per request. -She is class I risk which equates to 3.9% 30-day risk of perioperative death, cardiac arrest. -Okay to proceed to surgery  without further intervention assuming labs are within normal limits.  LGSIL on Pap smear of cervix on 04/28/21 -Colposcopy scheduled for January with GYN.  Time spent: 30 minutes reviewing chart, interviewing and examining patient and formulating plan of care.    Chaya Jan, MD [facility] at [facility]

## 2021-06-08 ENCOUNTER — Other Ambulatory Visit: Payer: BC Managed Care – PPO

## 2021-06-08 DIAGNOSIS — Z01818 Encounter for other preprocedural examination: Secondary | ICD-10-CM

## 2021-06-09 LAB — APTT: aPTT: 29 s (ref 23–32)

## 2021-06-09 LAB — PROTIME-INR
INR: 1
Prothrombin Time: 10 s (ref 9.0–11.5)

## 2021-06-20 ENCOUNTER — Encounter: Payer: Self-pay | Admitting: Internal Medicine

## 2021-06-26 ENCOUNTER — Telehealth: Payer: Self-pay | Admitting: Internal Medicine

## 2021-06-26 NOTE — Telephone Encounter (Addendum)
Pt was seen on 06-07-2021 and [facility] cosmetic surgeon needs the blood work  results to be  fax  to [phone]  and their phone number is [phone]

## 2021-06-27 NOTE — Telephone Encounter (Signed)
Most recent CBC, CMP, & clotting labs faxed to [facility] cosmetic.

## 2021-06-30 ENCOUNTER — Ambulatory Visit: Payer: BC Managed Care – PPO | Admitting: Obstetrics & Gynecology

## 2021-07-13 ENCOUNTER — Other Ambulatory Visit (HOSPITAL_COMMUNITY)
Admission: RE | Admit: 2021-07-13 | Discharge: 2021-07-13 | Disposition: A | Discharge status: Home / Self Care | Payer: BC Managed Care – PPO | Source: Ambulatory Visit | Attending: Obstetrics & Gynecology | Admitting: Obstetrics & Gynecology

## 2021-07-13 ENCOUNTER — Other Ambulatory Visit: Payer: Self-pay

## 2021-07-13 ENCOUNTER — Encounter: Payer: Self-pay | Admitting: Obstetrics & Gynecology

## 2021-07-13 ENCOUNTER — Ambulatory Visit (INDEPENDENT_AMBULATORY_CARE_PROVIDER_SITE_OTHER): Payer: BC Managed Care – PPO | Admitting: Obstetrics & Gynecology

## 2021-07-13 VITALS — BP 135/71 | HR 67 | Wt 166.9 lb

## 2021-07-13 DIAGNOSIS — Z3202 Encounter for pregnancy test, result negative: Secondary | ICD-10-CM

## 2021-07-13 DIAGNOSIS — R87612 Low grade squamous intraepithelial lesion on cytologic smear of cervix (LGSIL): Secondary | ICD-10-CM

## 2021-07-13 DIAGNOSIS — Z7185 Encounter for immunization safety counseling: Secondary | ICD-10-CM

## 2021-07-13 DIAGNOSIS — N871 Moderate cervical dysplasia: Secondary | ICD-10-CM | POA: Diagnosis not present

## 2021-07-13 LAB — POCT PREGNANCY, URINE: Preg Test, Ur: NEGATIVE

## 2021-07-13 NOTE — Patient Instructions (Addendum)
COLPOSCOPY POST-PROCEDURE INSTRUCTIONS  You may take Ibuprofen, Aleve or Tylenol for cramping if needed.  If Monsel's solution was used, you will have a black discharge.  Light bleeding is normal.  If bleeding is heavier than your period, please call.  Put nothing in your vagina until the bleeding or discharge stops (usually 2 or3 days).  We will call you within one week with biopsy results  

## 2021-07-13 NOTE — Progress Notes (Signed)
° ° °  GYNECOLOGY OFFICE COLPOSCOPY PROCEDURE NOTE  41 y.o. G0P0000 here for colposcopy for low-grade squamous intraepithelial neoplasia (LGSIL - encompassing HPV,mild dysplasia,CIN I) pap smear on 04/28/2021. Discussed role for HPV in cervical dysplasia, need for surveillance. Counseled about HPV vaccine, she wants to receive this.  Patient gave informed written consent, time out was performed.  Placed in lithotomy position. Cervix viewed with speculum and colposcope after application of acetic acid.   Colposcopy adequate? Yes Acetowhite lesion(s) noted at 12, 5  and 8 o'clock o'clock; corresponding biopsies obtained.  ECC specimen obtained. All specimens were labeled and sent to pathology.  Chaperone was present during entire procedure.  Patient was given post procedure instructions.  Will follow up pathology and manage accordingly; patient will be contacted with results and recommendations.  Routine preventative health maintenance measures emphasized.     Return in 1 month (on 08/10/2021) for RN visit for initiation of Gardasil injections.    Jaynie Collins, MD, FACOG Obstetrician & Gynecologist, [facility] for [employer], [facility]

## 2021-07-13 NOTE — Addendum Note (Signed)
Addended by: Cline Crock on: 07/13/2021 12:10 PM   Modules accepted: Orders

## 2021-07-17 ENCOUNTER — Encounter: Payer: Self-pay | Admitting: Obstetrics & Gynecology

## 2021-07-17 ENCOUNTER — Telehealth: Payer: Self-pay

## 2021-07-17 DIAGNOSIS — N871 Moderate cervical dysplasia: Secondary | ICD-10-CM | POA: Insufficient documentation

## 2021-07-17 DIAGNOSIS — D069 Carcinoma in situ of cervix, unspecified: Secondary | ICD-10-CM | POA: Insufficient documentation

## 2021-07-17 LAB — SURGICAL PATHOLOGY

## 2021-07-17 NOTE — Telephone Encounter (Addendum)
-----   Message from Tereso Newcomer, MD sent at 07/17/2021  8:32 AM EST ----- CIN 2 seen on colposcopy pathology. LEEP recommended given this high grade lesion.  Please call to inform patient of results and recommendations. She can visit with any MD to discus management if desired, before being scheduled for LEEP.    Jaynie Collins, MD   Called pt; VM left stating I am calling with results and callback number given. Will attempt to contact patient a second time.

## 2021-07-18 NOTE — Telephone Encounter (Signed)
Called pt for second attempt. Reviewed provider results and recommendation with patient. Pt would like to go ahead and schedule LEEP with [facility]. Briefly reviewed LEEP procedure. Instructed pt to avoid unprotected intercourse for 2 weeks prior to appt. Recommended pt take Tylenol or ibuprofen prior to appt time. Pt asks about any work restrictions. Explained that she may want to take the rest of the day off for rest, but that recovery should be very short. [place] office notified and appt scheduled for 08/25/21. Gardasil nurse visit cancelled. Pt will receive first dose of Gardasil at LEEP appt.

## 2021-08-10 ENCOUNTER — Ambulatory Visit: Payer: BC Managed Care – PPO

## 2021-08-25 ENCOUNTER — Encounter: Payer: Self-pay | Admitting: Obstetrics & Gynecology

## 2021-08-25 ENCOUNTER — Other Ambulatory Visit (HOSPITAL_COMMUNITY)
Admission: RE | Admit: 2021-08-25 | Discharge: 2021-08-25 | Disposition: A | Discharge status: Home / Self Care | Payer: BC Managed Care – PPO | Source: Ambulatory Visit | Attending: Obstetrics & Gynecology | Admitting: Obstetrics & Gynecology

## 2021-08-25 ENCOUNTER — Other Ambulatory Visit: Payer: Self-pay

## 2021-08-25 ENCOUNTER — Ambulatory Visit (INDEPENDENT_AMBULATORY_CARE_PROVIDER_SITE_OTHER): Payer: BC Managed Care – PPO | Admitting: Obstetrics & Gynecology

## 2021-08-25 VITALS — BP 146/86 | HR 68 | Wt 165.3 lb

## 2021-08-25 DIAGNOSIS — N871 Moderate cervical dysplasia: Secondary | ICD-10-CM

## 2021-08-25 DIAGNOSIS — D069 Carcinoma in situ of cervix, unspecified: Secondary | ICD-10-CM | POA: Diagnosis not present

## 2021-08-25 DIAGNOSIS — Z3202 Encounter for pregnancy test, result negative: Secondary | ICD-10-CM | POA: Diagnosis not present

## 2021-08-25 LAB — POCT PREGNANCY, URINE: Preg Test, Ur: NEGATIVE

## 2021-08-25 NOTE — Progress Notes (Signed)
? ?  GYNECOLOGY OFFICE PROCEDURE NOTE ? ?Mary Petersen is a 41 y.o. G0 here for LEEP. No GYN concerns. Pap smear and colposcopy history reviewed.   ? ?Pap LGSIL on 04/28/21 ?Colpo Biopsy CIN2 with benign ECC on 07/13/21 ? ?Risks, benefits, alternatives, and limitations of procedure explained to patient, including pain, bleeding, infection, failure to remove abnormal tissue and failure to cure dysplasia, need for repeat procedures, damage to pelvic organs, cervical incompetence.  Role of HPV,cervical dysplasia and need for close followup was empasized. Informed written consent was obtained. All questions were answered. Time out performed. Urine pregnancy test was negative. ? ???Procedure: The patient was placed in lithotomy position and the bivalved coated speculum was placed in the patient's vagina. A grounding pad placed on the patient. Lugol's solution was applied to the cervix and a very large area of decreased uptake was noted around the transformation zone with irregular margins as seen in image below: ? ? ? ?The area was more than the limit our excisors in office, patient was told of high likelihood of positive margins, and need for repeat LEEP.   Local anesthesia was administered via an intracervical block using 10 ml of 2% Lidocaine with epinephrine. The suction was turned on and the Large 1X Fisher Cone Biopsy Excisor on 67 Watts of blended current was used to excise the area of decreased uptake. It was impossible to excise the affected area with one pass; had to go back and excise superior and inferior regions.  Using roller ball coagulation set at 60 Watts coagulation, the irregular edges of the affected area were treated, and good hemostasis was achieved.  Monsel's solution was then applied and the speculum was removed from the vagina. Specimens were sent to pathology. ? ??The patient tolerated the procedure well. Post-operative instructions given to patient, including instruction to seek medical  attention for persistent bright red bleeding, fever, abdominal/pelvic pain, dysuria, nausea or vomiting. She was also told about the possibility of having copious yellow to black tinged discharge for weeks. She was counseled to avoid anything in the vagina (sex/douching/tampons) for 3 weeks. She has a 4 week post-operative check to assess wound healing, review results and discuss further management; she knows of the high likelihood of needing a repeat LEEP.  ? ? ?Jaynie Collins, MD, FACOG ?Obstetrician Heritage manager, Faculty Practice ?Center for [employer], [facility] ? ?

## 2021-08-28 LAB — SURGICAL PATHOLOGY

## 2021-08-29 ENCOUNTER — Telehealth: Payer: Self-pay | Admitting: *Deleted

## 2021-08-29 ENCOUNTER — Encounter: Payer: Self-pay | Admitting: Obstetrics & Gynecology

## 2021-08-29 NOTE — Telephone Encounter (Signed)
I called Mary Petersen and gave her results and recommendations per Dr. Nonah Mattes. She states she was supposed to her her gardisil at LEEP appointment but forgot because it ran long. Would like to get at follow up on 09/26/21.  I informed her that should be fine. She voices understanding. ?[name] ?

## 2021-08-29 NOTE — Telephone Encounter (Signed)
-----   Message from Tereso Newcomer, MD sent at 08/29/2021  8:25 AM EDT ----- ?High grade dysplasia - CIN 3 resected with LEEP specimen, but margins are positive with low grade dysplasia (negative for high grade dysplasia which is reassuring).  Need to repeat pap and cotesting in 6 months.  Attempted to call patient but it went to voicemail.  Please call to inform patient of results and recommendations. ? ? ?Jaynie Collins, MD ?

## 2021-09-26 ENCOUNTER — Ambulatory Visit: Payer: BC Managed Care – PPO | Admitting: Family Medicine

## 2021-11-28 ENCOUNTER — Telehealth: Payer: BC Managed Care – PPO | Admitting: Physician Assistant

## 2021-11-28 DIAGNOSIS — R3989 Other symptoms and signs involving the genitourinary system: Secondary | ICD-10-CM

## 2021-11-28 MED ORDER — CEPHALEXIN 500 MG PO CAPS: 500.0000 mg | ORAL_CAPSULE | Freq: Two times a day (BID) | ORAL | 0 refills | Status: AC

## 2021-11-28 NOTE — Progress Notes (Signed)

## 2021-11-28 NOTE — Progress Notes (Signed)
I have spent 5 minutes in review of e-visit questionnaire, review and updating patient chart, medical decision making and response to patient.   Swathi Dauphin Cody Quy Lotts, PA-C    

## 2022-05-02 ENCOUNTER — Other Ambulatory Visit (HOSPITAL_COMMUNITY)
Admission: RE | Admit: 2022-05-02 | Discharge: 2022-05-02 | Disposition: A | Discharge status: Home / Self Care | Payer: BC Managed Care – PPO | Source: Ambulatory Visit | Attending: Obstetrics & Gynecology | Admitting: Obstetrics & Gynecology

## 2022-05-02 ENCOUNTER — Ambulatory Visit: Payer: BC Managed Care – PPO | Admitting: Obstetrics & Gynecology

## 2022-05-02 ENCOUNTER — Other Ambulatory Visit: Payer: Self-pay | Admitting: Obstetrics & Gynecology

## 2022-05-02 ENCOUNTER — Encounter: Payer: Self-pay | Admitting: Obstetrics & Gynecology

## 2022-05-02 VITALS — BP 146/86 | HR 69 | Ht 67.0 in | Wt 164.0 lb

## 2022-05-02 DIAGNOSIS — D069 Carcinoma in situ of cervix, unspecified: Secondary | ICD-10-CM

## 2022-05-02 DIAGNOSIS — Z01419 Encounter for gynecological examination (general) (routine) without abnormal findings: Secondary | ICD-10-CM | POA: Insufficient documentation

## 2022-05-02 DIAGNOSIS — Z1231 Encounter for screening mammogram for malignant neoplasm of breast: Secondary | ICD-10-CM

## 2022-05-02 NOTE — Progress Notes (Signed)
GYNECOLOGY ANNUAL PREVENTATIVE CARE ENCOUNTER NOTE  History:     Mary Petersen is a 41 y.o. G0 female here for a routine annual gynecologic exam.  Current complaints: none.  Had LEEP for CIN 3 on 34/17/2023, margins positive for high grade dysplasia. Denies abnormal vaginal bleeding, discharge, pelvic pain, problems with intercourse or other gynecologic concerns.    Gynecologic History Patient's last menstrual period was 04/29/2022 (exact date). Contraception: none History of CIN 3 as above s/p LEEP in 08/25/2021  Obstetric History OB History  Gravida Para Term Preterm AB Living  0 0 0 0 0 0  SAB IAB Ectopic Multiple Live Births  0 0 0 0 0    No past medical history on file.  Past Surgical History:  Procedure Laterality Date   PLACEMENT OF BREAST IMPLANTS  05/03/2004    Current Outpatient Medications on File Prior to Visit  Medication Sig Dispense Refill   b complex vitamins capsule Take 1 capsule by mouth daily.     Multiple Vitamins-Minerals (HAIR SKIN NAILS PO) Take by mouth.     No current facility-administered medications on file prior to visit.    Allergies  Allergen Reactions   Sulfa Antibiotics     Swelling with hives    Social History:  reports that she has never smoked. She has never used smokeless tobacco. She reports current alcohol use. She reports that she does not use drugs.  Family History  Problem Relation Age of Onset   Cancer Mother 93       Breast cancer - she was taking exogenous hormones. Negative testing for all mutations   Breast cancer Mother    Melanoma Mother     The following portions of the patient's history were reviewed and updated as appropriate: allergies, current medications, past family history, past medical history, past social history, past surgical history and problem list.  Review of Systems Pertinent items noted in HPI and remainder of comprehensive ROS otherwise negative.  Physical Exam:  BP (!) 146/86    Pulse 69   Ht 5\' 7"  (1.702 m)   Wt 164 lb (74.4 kg)   LMP 04/29/2022 (Exact Date)   BMI 25.69 kg/m  CONSTITUTIONAL: Well-developed, well-nourished female in no acute distress.  HENT:  Normocephalic, atraumatic, External right and left ear normal.  EYES: Conjunctivae and EOM are normal. Pupils are equal, round, and reactive to light. No scleral icterus.  NECK: Normal range of motion, supple, no masses.  Normal thyroid.  SKIN: Skin is warm and dry. No rash noted. Not diaphoretic. No erythema. No pallor. MUSCULOSKELETAL: Normal range of motion. No tenderness.  No cyanosis, clubbing, or edema. NEUROLOGIC: Alert and oriented to person, place, and time. Normal reflexes, muscle tone coordination.  PSYCHIATRIC: Normal mood and affect. Normal behavior. Normal judgment and thought content. CARDIOVASCULAR: Normal heart rate noted, regular rhythm RESPIRATORY: Clear to auscultation bilaterally. Effort and breath sounds normal, no problems with respiration noted. BREASTS: Symmetric in size, implants in place. No masses, tenderness, skin changes, nipple drainage, or lymphadenopathy bilaterally. Performed in the presence of a chaperone. ABDOMEN: Soft, no distention noted.  No tenderness, rebound or guarding.  PELVIC: Normal appearing external genitalia and urethral meatus; normal appearing vaginal mucosa and cervix.  Moderate blood in vault, actively menstruating. Cleared with fox swabs. No abnormal vaginal discharge noted.  Pap smear obtained.  Normal uterine size, no other palpable masses, no uterine or adnexal tenderness.  Performed in the presence of a chaperone.   Assessment  and Plan:     1. Breast cancer screening by mammogram Mammogram scheduled - MM 3D SCREEN BREAST W/IMPLANT BILATERAL; Future  2. Severe dysplasia of cervix (CIN III) 3. Well woman exam with routine gynecological exam - Cytology - PAP Will follow up results of pap smear and manage accordingly. Routine preventative health  maintenance measures emphasized. Please refer to After Visit Summary for other counseling recommendations.      Jaynie Collins, MD, FACOG Obstetrician & Gynecologist, [facility] for [employer], [facility]

## 2022-05-08 ENCOUNTER — Inpatient Hospital Stay: Admission: RE | Admit: 2022-05-08 | Payer: BC Managed Care – PPO | Source: Ambulatory Visit

## 2022-05-09 LAB — CYTOLOGY - PAP
Comment: NEGATIVE
High risk HPV: NEGATIVE

## 2022-05-10 ENCOUNTER — Encounter: Payer: Self-pay | Admitting: Obstetrics & Gynecology

## 2022-05-10 ENCOUNTER — Ambulatory Visit
Admission: RE | Admit: 2022-05-10 | Discharge: 2022-05-10 | Disposition: A | Discharge status: Home / Self Care | Payer: BC Managed Care – PPO | Source: Ambulatory Visit | Attending: Obstetrics & Gynecology | Admitting: Obstetrics & Gynecology

## 2022-05-10 DIAGNOSIS — R87619 Unspecified abnormal cytological findings in specimens from cervix uteri: Secondary | ICD-10-CM | POA: Insufficient documentation

## 2022-05-10 DIAGNOSIS — Z1231 Encounter for screening mammogram for malignant neoplasm of breast: Secondary | ICD-10-CM | POA: Diagnosis not present

## 2022-05-14 ENCOUNTER — Telehealth: Payer: Self-pay

## 2022-05-14 NOTE — Telephone Encounter (Signed)
TC to pt to make aware of results and recommendations Pt not ava VM box is full.

## 2022-05-14 NOTE — Telephone Encounter (Addendum)
-----   Message from Tereso Newcomer, MD sent at 05/10/2022  7:55 PM EST ----- Further evaluation is needed for atypical endocervical cells; need to do endometrial biopsy and endocervical curettings.  Please call to inform patient of results and recommendations.

## 2022-05-15 ENCOUNTER — Telehealth: Payer: Self-pay | Admitting: *Deleted

## 2022-05-15 NOTE — Telephone Encounter (Signed)
Left message for pt to call back to go over pap results  

## 2022-05-15 NOTE — Telephone Encounter (Signed)
-----   Message from Ugonna A Anyanwu, MD sent at 05/10/2022  7:55 PM EST ----- Further evaluation is needed for atypical endocervical cells; need to do endometrial biopsy and endocervical curettings.  Please call to inform patient of results and recommendations.  

## 2022-05-31 ENCOUNTER — Telehealth: Payer: Self-pay | Admitting: Obstetrics & Gynecology

## 2022-05-31 NOTE — Telephone Encounter (Signed)
Patient calling for test results. °

## 2022-06-01 NOTE — Telephone Encounter (Signed)
Called pt; VM left stating I am returning call about results. Reviewed pt can view in MyChart. Reviewed upcoming holiday office hours for call back.

## 2022-07-03 ENCOUNTER — Other Ambulatory Visit (HOSPITAL_COMMUNITY)
Admission: RE | Admit: 2022-07-03 | Discharge: 2022-07-03 | Disposition: A | Discharge status: Home / Self Care | Payer: BC Managed Care – PPO | Source: Ambulatory Visit | Attending: Obstetrics & Gynecology | Admitting: Obstetrics & Gynecology

## 2022-07-03 ENCOUNTER — Ambulatory Visit: Payer: BC Managed Care – PPO | Admitting: Obstetrics & Gynecology

## 2022-07-03 ENCOUNTER — Other Ambulatory Visit: Payer: BC Managed Care – PPO | Admitting: Obstetrics & Gynecology

## 2022-07-03 ENCOUNTER — Encounter: Payer: Self-pay | Admitting: Obstetrics & Gynecology

## 2022-07-03 VITALS — BP 145/89 | HR 69 | Wt 170.0 lb

## 2022-07-03 DIAGNOSIS — R87619 Unspecified abnormal cytological findings in specimens from cervix uteri: Secondary | ICD-10-CM

## 2022-07-03 DIAGNOSIS — D069 Carcinoma in situ of cervix, unspecified: Secondary | ICD-10-CM | POA: Insufficient documentation

## 2022-07-03 DIAGNOSIS — N888 Other specified noninflammatory disorders of cervix uteri: Secondary | ICD-10-CM | POA: Diagnosis not present

## 2022-07-03 DIAGNOSIS — R87618 Other abnormal cytological findings on specimens from cervix uteri: Secondary | ICD-10-CM

## 2022-07-03 NOTE — Progress Notes (Signed)
GYNECOLOGY OFFICE PROCEDURE NOTE   Mary Petersen is a 42 y.o. G0P0000 here for endocervical curetting and endometrial biopsy for recent pap smear on 05/02/2022 that showed atypical endocervical cells. This was the follow up pap smear after LEEP was done for CIN 3 on 08/25/2021.   Today, she reports no concerning symptoms.    ENDOCERVICAL CURETTING AND ENDOMETRIAL BIOPSY     The indications for procedures were reviewed.   Risks of the biopsy including cramping, bleeding, infection, uterine perforation, inadequate specimen and need for additional procedures were discussed.  Urine pregnancy test was negative. Consent was signed. Time out was performed. Chaperone was present during entire procedure.   Patient was positioned in dorsal lithotomy position. A vaginal speculum was placed.  The cervix was visualized and was prepped with Betadine.  A single-toothed tenaculum was placed on the anterior lip of the cervix to stabilize it. The endocervical curette was advanced into endocervix and rotated to obtain cells; the cells were picked up using two endocervical brushes. Then, the 3 mm pipelle was easily introduced into the endometrial cavity without difficulty to a depth of 8 cm, and a moderate amount of tissue was obtained after two passes. Both specimens were sent to pathology. The instruments were removed from the patient's vagina. Minimal bleeding from the cervix was noted. The patient tolerated the procedure well.   Patient was given post procedure instructions.  Will follow up pathology and manage accordingly; patient will be contacted with results and recommendations.  Routine preventative health maintenance measures emphasized.       Verita Schneiders, MD, [place] for [employer], [place]

## 2022-07-03 NOTE — Patient Instructions (Signed)
ENDOMETRIAL BIOPSY POST-PROCEDURE INSTRUCTIONS  You may take Ibuprofen, Aleve or Tylenol for pain if needed.  Cramping should resolve within in 24 hours.  You may have a small amount of spotting.  You should wear a mini pad for the next few days.  You may have intercourse after 24 hours.  You need to call if you have any pelvic pain, fever, heavy bleeding or foul smelling vaginal discharge.  Shower or bathe as normal  6. We will call you within one week with results  

## 2022-07-04 LAB — SURGICAL PATHOLOGY

## 2022-07-10 ENCOUNTER — Encounter: Payer: Self-pay | Admitting: Internal Medicine

## 2022-07-17 ENCOUNTER — Encounter: Payer: Self-pay | Admitting: Internal Medicine

## 2022-07-17 ENCOUNTER — Ambulatory Visit: Payer: BC Managed Care – PPO | Admitting: Internal Medicine

## 2022-07-17 VITALS — BP 120/80 | HR 79 | Temp 97.7°F | Ht 68.0 in | Wt 165.1 lb

## 2022-07-17 DIAGNOSIS — Z01818 Encounter for other preprocedural examination: Secondary | ICD-10-CM | POA: Diagnosis not present

## 2022-07-17 LAB — CBC WITH DIFFERENTIAL/PLATELET
Basophils Absolute: 0.1 10*3/uL (ref 0.0–0.1)
Basophils Relative: 1.5 % (ref 0.0–3.0)
Eosinophils Absolute: 0.3 10*3/uL (ref 0.0–0.7)
Eosinophils Relative: 6 % — ABNORMAL HIGH (ref 0.0–5.0)
HCT: 37.6 % (ref 36.0–46.0)
Hemoglobin: 12.8 g/dL (ref 12.0–15.0)
Lymphocytes Relative: 26.1 % (ref 12.0–46.0)
Lymphs Abs: 1.2 10*3/uL (ref 0.7–4.0)
MCHC: 33.9 g/dL (ref 30.0–36.0)
MCV: 96.7 fl (ref 78.0–100.0)
Monocytes Absolute: 0.4 10*3/uL (ref 0.1–1.0)
Monocytes Relative: 8.1 % (ref 3.0–12.0)
Neutro Abs: 2.7 10*3/uL (ref 1.4–7.7)
Neutrophils Relative %: 58.3 % (ref 43.0–77.0)
Platelets: 341 10*3/uL (ref 150.0–400.0)
RBC: 3.89 Mil/uL (ref 3.87–5.11)
RDW: 13.8 % (ref 11.5–15.5)
WBC: 4.7 10*3/uL (ref 4.0–10.5)

## 2022-07-17 LAB — HEMOGLOBIN A1C: Hgb A1c MFr Bld: 4.6 % (ref 4.6–6.5)

## 2022-07-17 LAB — APTT: aPTT: 34.1 s (ref 25.4–36.8)

## 2022-07-17 LAB — PROTIME-INR
INR: 1 ratio (ref 0.8–1.0)
Prothrombin Time: 11 s (ref 9.6–13.1)

## 2022-07-17 NOTE — Progress Notes (Signed)
     Established Patient Office Visit     CC/Reason for Visit: Preoperative clearance  HPI: Mary Petersen is a 42 y.o. female who is coming in today for the above mentioned reasons.  She will be having bilateral blepharoplasty.  Her surgeon is requesting some labs prior.  She exercises routinely without chest pain or shortness of breath.  No family history of premature coronary artery disease.   Past Medical/Surgical History: No past medical history on file.  Past Surgical History:  Procedure Laterality Date   AUGMENTATION MAMMAPLASTY     PLACEMENT OF BREAST IMPLANTS  05/03/2004    Social History:  reports that she has never smoked. She has never used smokeless tobacco. She reports current alcohol use. She reports that she does not use drugs.  Allergies: Allergies  Allergen Reactions   Sulfa Antibiotics     Swelling with hives    Family History:  Family History  Problem Relation Age of Onset   Cancer Mother 10       Breast cancer - she was taking exogenous hormones. Negative testing for all mutations   Breast cancer Mother    Melanoma Mother      Current Outpatient Medications:    b complex vitamins capsule, Take 1 capsule by mouth daily., Disp: , Rfl:    Multiple Vitamins-Minerals (HAIR SKIN NAILS PO), Take by mouth., Disp: , Rfl:   Review of Systems:  Negative unless indicated in HPI.   Physical Exam: Vitals:   07/17/22 1135  BP: 120/80  Pulse: 79  Temp: 97.7 F (36.5 C)  TempSrc: Oral  SpO2: 99%  Weight: 165 lb 1.6 oz (74.9 kg)  Height: 5\' 8"  (1.727 m)    Body mass index is 25.1 kg/m.   Physical Exam Vitals reviewed.  Constitutional:      Appearance: Normal appearance.  HENT:     Head: Normocephalic and atraumatic.  Eyes:     Conjunctiva/sclera: Conjunctivae normal.     Pupils: Pupils are equal, round, and reactive to light.  Cardiovascular:     Rate and Rhythm: Normal rate and regular rhythm.  Pulmonary:     Effort: Pulmonary  effort is normal.     Breath sounds: Normal breath sounds.  Skin:    General: Skin is warm and dry.  Neurological:     General: No focal deficit present.     Mental Status: She is alert and oriented to person, place, and time.  Psychiatric:        Mood and Affect: Mood normal.        Behavior: Behavior normal.        Thought Content: Thought content normal.        Judgment: Judgment normal.      Impression and Plan:  Preoperative clearance - Plan: CBC with Differential/Platelet, Complete Metabolic Panel with eGFR, Hemoglobin A1c, Protime-INR, APTT  -Okay to proceed to surgery without further cardiac workup. -Labs requested by plastic surgeon will be performed today and faxed back once resulted.   Time spent:31 minutes reviewing chart, interviewing and examining patient and formulating plan of care.     Lelon Frohlich, MD [facility]

## 2022-07-18 LAB — COMPLETE METABOLIC PANEL WITH GFR
AG Ratio: 2.1 (calc) (ref 1.0–2.5)
ALT: 9 U/L (ref 6–29)
AST: 16 U/L (ref 10–30)
Albumin: 4.7 g/dL (ref 3.6–5.1)
Alkaline phosphatase (APISO): 57 U/L (ref 31–125)
BUN: 14 mg/dL (ref 7–25)
CO2: 27 mmol/L (ref 20–32)
Calcium: 10.1 mg/dL (ref 8.6–10.2)
Chloride: 103 mmol/L (ref 98–110)
Creat: 0.69 mg/dL (ref 0.50–0.99)
Globulin: 2.2 g/dL (calc) (ref 1.9–3.7)
Glucose, Bld: 86 mg/dL (ref 65–99)
Potassium: 4.5 mmol/L (ref 3.5–5.3)
Sodium: 139 mmol/L (ref 135–146)
Total Bilirubin: 0.8 mg/dL (ref 0.2–1.2)
Total Protein: 6.9 g/dL (ref 6.1–8.1)
eGFR: 112 mL/min/{1.73_m2} (ref >=60)

## 2022-11-05 ENCOUNTER — Telehealth: Payer: BC Managed Care – PPO | Admitting: Physician Assistant

## 2022-11-05 DIAGNOSIS — L03213 Periorbital cellulitis: Secondary | ICD-10-CM

## 2022-11-05 MED ORDER — AMOXICILLIN-POT CLAVULANATE 875-125 MG PO TABS: 1.0000 | ORAL_TABLET | Freq: Two times a day (BID) | ORAL | 0 refills | Status: DC

## 2022-11-05 MED ORDER — PREDNISONE 20 MG PO TABS: 40.0000 mg | ORAL_TABLET | Freq: Every day | ORAL | 0 refills | Status: DC

## 2022-11-05 NOTE — Patient Instructions (Signed)
Mary Petersen, thank you for joining Margaretann Loveless, PA-C for today's virtual visit.  While this provider is not your primary care provider (PCP), if your PCP is located in our provider database this encounter information will be shared with them immediately following your visit.   A [facility] MyChart account gives you access to today's visit and all your visits, tests, and labs performed at [facility] " click here if you don't have a [facility] MyChart account or go to [email or web address]  Consent: (Patient) Mary Petersen provided verbal consent for this virtual visit at the beginning of the encounter.  Current Medications:  Current Outpatient Medications:    amoxicillin-clavulanate (AUGMENTIN) 875-125 MG tablet, Take 1 tablet by mouth 2 (two) times daily., Disp: 20 tablet, Rfl: 0   predniSONE (DELTASONE) 20 MG tablet, Take 2 tablets (40 mg total) by mouth daily with breakfast., Disp: 10 tablet, Rfl: 0   b complex vitamins capsule, Take 1 capsule by mouth daily., Disp: , Rfl:    Multiple Vitamins-Minerals (HAIR SKIN NAILS PO), Take by mouth., Disp: , Rfl:    Medications ordered in this encounter:  Meds ordered this encounter  Medications   amoxicillin-clavulanate (AUGMENTIN) 875-125 MG tablet    Sig: Take 1 tablet by mouth 2 (two) times daily.    Dispense:  20 tablet    Refill:  0    Order Specific Question:   Supervising Provider    Answer:   Merrilee Jansky [[id number]]   predniSONE (DELTASONE) 20 MG tablet    Sig: Take 2 tablets (40 mg total) by mouth daily with breakfast.    Dispense:  10 tablet    Refill:  0    Order Specific Question:   Supervising Provider    Answer:   Merrilee Jansky [[id number]]     *If you need refills on other medications prior to your next appointment, please contact your pharmacy*  Follow-Up: Call back or seek an in-person evaluation if the symptoms worsen or if the condition fails to improve as  anticipated.  [facility] [phone]  Other Instructions Preseptal Cellulitis, Adult Preseptal cellulitis is an infection of the eyelid and the tissues around the eye (periorbital area). The infection causes painful swelling and redness. This condition may also be called periorbital cellulitis. In most cases, the condition can be treated with antibiotic medicine at home. It is important to treat preseptal cellulitis right away so that it does not get worse. If it gets worse, it can spread to the eye socket and eye muscles (orbital cellulitis). Orbital cellulitis is a medical emergency. What are the causes? Preseptal cellulitis is most commonly caused by bacteria. In rare cases, it can be caused by a virus or fungus. The germs that cause preseptal cellulitis may come from: A sinus infection that spreads near the eyes. An injury near the eye, such as a scratch, puncture wound, animal bite, or insect bite. A skin rash, such as eczema or poison ivy, that becomes infected. An infected pimple on the eyelid (stye). Infection after eyelid surgery or injury. What increases the risk? You are more likely to develop this condition if: You have a weakened disease-fighting system (immune system). You have a medical condition that raises your risk for sinus infections, such as nasal polyps. What are the signs or symptoms? Symptoms of this condition include: Eyelids that are red and swollen and feel unusually hot. Fever. Difficulty opening the eye. Headache. Pain in the face. Symptoms  of this condition usually develop suddenly. How is this diagnosed? This condition may be diagnosed based on your symptoms, your medical history, and an eye exam. You may also have tests, such as: Blood tests. Tests (cultures) to find out which specific bacteria are causing the infection. You may have a culture of any open wound or drainage. CT scan. MRI. This is less common. How is this treated? This  condition is treated with antibiotic medicines. These may be given by mouth (orally), through an IV, or as an injection. In rare cases, you may need surgery to drain an infected area. Follow these instructions at home: Medicines Take your antibiotic medicine as told by your health care provider. Do not stop taking the antibiotic even if you start to feel better Take over-the-counter and prescription medicines only as told by your health care provider. Eye Care Do not use eye drops without first getting approval from your health care provider. Do not touch or rub your eye. If you wear contact lenses, do not wear them until your health care provider approves. Keep the eye area clean and dry. Wash the eye area with a clean washcloth, warm water, and baby shampoo or mild soap. To help relieve discomfort, place a clean washcloth that is wet with warm water over your eye. Leave the washcloth on for a few minutes, then remove it. General instructions Wash your hands with soap and water often for at least 20 seconds. If soap and water are not available, use hand sanitizer. Do not use any products that contain nicotine or tobacco, such as cigarettes, e-cigarettes, and chewing tobacco. If you need help quitting, ask your health care provider. Drink enough fluid to keep your urine pale yellow. Do not drive or operate machinery until your health care provider says that it is safe. Ask your health care provider if it is safe for you to drive. Stay up to date on your vaccinations. Keep all follow-up visits. This includes any visits with an eye specialist (ophthalmologist) or dentist. This is important. Get help right away if: You have new symptoms. Your symptoms get worse or do not get better with treatment. You have a fever. Your vision becomes blurry or gets worse in any way. Your eye looks like it is sticking out or bulging out (proptosis). You develop double vision. You have trouble moving your eyes  or pain when moving your eyes You have a severe headache. You have neck stiffness or severe neck pain. These symptoms may represent a serious problem that is an emergency. Do not wait to see if the symptoms will go away. Get medical help right away. Call your local emergency services (911 in the [place] Do not drive yourself to the hospital. Summary Preseptal cellulitis is an infection of the eyelid and the tissues around the eye. Symptoms of preseptal cellulitis usually develop suddenly and include red and swollen eyelids, fever, difficulty opening the eye, headache, and facial pain. This condition is treated with antibiotic medicines. Do not stop taking the antibiotic even if you start to feel better. Preseptal cellulitis can develop into orbital cellulitis, which is a medical emergency. If your condition does not improve or worsens, visit your heath care provider right away. This information is not intended to replace advice given to you by your health care provider. Make sure you discuss any questions you have with your health care provider. Document Revised: 09/30/2019 Document Reviewed: 09/30/2019 Elsevier Patient Education  2024 [employer].    If you  have been instructed to have an in-person evaluation today at a local Urgent Care facility, please use the link below. It will take you to a list of all of our available [facility], including address, phone number and hours of operation. Please do not delay care.  [facility]  If you or a family member do not have a primary care provider, use the link below to schedule a visit and establish care. When you choose a [facility] primary care physician or advanced practice provider, you gain a long-term partner in health. Find a Primary Care Provider  Learn more about [facility]'s in-office and virtual care options: [facility] - Get Care Now

## 2022-11-05 NOTE — Progress Notes (Signed)
Virtual Visit Consent   Mary Petersen, you are scheduled for a virtual visit with a [facility] provider today. Just as with appointments in the office, your consent must be obtained to participate. Your consent will be active for this visit and any virtual visit you may have with one of our providers in the next 365 days. If you have a MyChart account, a copy of this consent can be sent to you electronically.  As this is a virtual visit, video technology does not allow for your provider to perform a traditional examination. This may limit your provider's ability to fully assess your condition. If your provider identifies any concerns that need to be evaluated in person or the need to arrange testing (such as labs, EKG, etc.), we will make arrangements to do so. Although advances in technology are sophisticated, we cannot ensure that it will always work on either your end or our end. If the connection with a video visit is poor, the visit may have to be switched to a telephone visit. With either a video or telephone visit, we are not always able to ensure that we have a secure connection.  By engaging in this virtual visit, you consent to the provision of healthcare and authorize for your insurance to be billed (if applicable) for the services provided during this visit. Depending on your insurance coverage, you may receive a charge related to this service.  I need to obtain your verbal consent now. Are you willing to proceed with your visit today? Mary Petersen has provided verbal consent on 11/05/2022 for a virtual visit (video or telephone). Margaretann Loveless, PA-C  Date: 11/05/2022 10:47 AM  Virtual Visit via Video Note   I, Margaretann Loveless, connected with  Mary Petersen  ([id number], May 12, 1981) on 11/05/22 at 10:45 AM EDT by a video-enabled telemedicine application and verified that I am speaking with the correct person using two identifiers.  Location: Patient:  Virtual Visit Location Patient: Home Provider: Virtual Visit Location Provider: Home Office   I discussed the limitations of evaluation and management by telemedicine and the availability of in person appointments. The patient expressed understanding and agreed to proceed.    History of Present Illness: Mary Petersen is a 42 y.o. who identifies as a female who was assigned female at birth, and is being seen today for swollen eyes.  HPI: Eye Problem  Both eyes are affected. This is a new problem. The current episode started yesterday. There is No known exposure to pink eye. She Does not wear contacts. Associated symptoms include eye redness and a recent URI. Pertinent negatives include no blurred vision, double vision, fever, foreign body sensation, nausea, photophobia or vomiting. She has tried nothing for the symptoms. The treatment provided no relief.    Denies any changes to facial soaps, lotions, eye creams, make up. No known allergy trigger.   Did have a sore throat a day or so before the eyes started to bother her. Has continued congestion, sounds worse, but overall does not feel that bad.   Noticed eyes starting to bother her some yesterday and last night, but awoke this morning with more swollen around both eyes and has not improved as the day has progressed.  Problems:  Patient Active Problem List   Diagnosis Date Noted   Atypical endocervical cells on cervical Papanicolaou smear 05/10/2022   Severe dysplasia of cervix (CIN III) 07/17/2021   LGSIL on Pap smear of cervix on 04/28/21 05/09/2021  Allergies:  Allergies  Allergen Reactions   Sulfa Antibiotics     Swelling with hives   Medications:  Current Outpatient Medications:    amoxicillin-clavulanate (AUGMENTIN) 875-125 MG tablet, Take 1 tablet by mouth 2 (two) times daily., Disp: 20 tablet, Rfl: 0   predniSONE (DELTASONE) 20 MG tablet, Take 2 tablets (40 mg total) by mouth daily with breakfast., Disp: 10 tablet,  Rfl: 0   b complex vitamins capsule, Take 1 capsule by mouth daily., Disp: , Rfl:    Multiple Vitamins-Minerals (HAIR SKIN NAILS PO), Take by mouth., Disp: , Rfl:   Observations/Objective: Patient is well-developed, well-nourished in no acute distress.  Resting comfortably at home.  Head is normocephalic, atraumatic.  No labored breathing.  Speech is clear and coherent with logical content.  Patient is alert and oriented at baseline.    Assessment and Plan: 1. Preseptal cellulitis of left eye - amoxicillin-clavulanate (AUGMENTIN) 875-125 MG tablet; Take 1 tablet by mouth 2 (two) times daily.  Dispense: 20 tablet; Refill: 0 - predniSONE (DELTASONE) 20 MG tablet; Take 2 tablets (40 mg total) by mouth daily with breakfast.  Dispense: 10 tablet; Refill: 0  2. Preseptal cellulitis of right eye - amoxicillin-clavulanate (AUGMENTIN) 875-125 MG tablet; Take 1 tablet by mouth 2 (two) times daily.  Dispense: 20 tablet; Refill: 0 - predniSONE (DELTASONE) 20 MG tablet; Take 2 tablets (40 mg total) by mouth daily with breakfast.  Dispense: 10 tablet; Refill: 0  - Suspect possible early preseptal cellulitis - Augmentin prescribed - Prednisone for swelling - Tylenol if needed - Cold compresses - Seek in person evaluation if worsening or fails to improve with treatment  Follow Up Instructions: I discussed the assessment and treatment plan with the patient. The patient was provided an opportunity to ask questions and all were answered. The patient agreed with the plan and demonstrated an understanding of the instructions.  A copy of instructions were sent to the patient via MyChart unless otherwise noted below.    The patient was advised to call back or seek an in-person evaluation if the symptoms worsen or if the condition fails to improve as anticipated.  Time:  I spent 10 minutes with the patient via telehealth technology discussing the above problems/concerns.    Margaretann Loveless,  PA-C

## 2022-12-05 ENCOUNTER — Encounter: Payer: Self-pay | Admitting: Family Medicine

## 2022-12-05 ENCOUNTER — Ambulatory Visit: Payer: BC Managed Care – PPO | Admitting: Family Medicine

## 2022-12-05 VITALS — BP 120/82 | HR 93 | Temp 98.1°F | Wt 169.0 lb

## 2022-12-05 DIAGNOSIS — R220 Localized swelling, mass and lump, head: Secondary | ICD-10-CM

## 2022-12-05 MED ORDER — PREDNISONE 10 MG PO TABS: ORAL_TABLET | ORAL | 0 refills | Status: AC

## 2022-12-05 NOTE — Progress Notes (Signed)
   Subjective:    Patient ID: Mary Petersen, female    DOB: [birth year], 42 y.o.   MRN: [id number]  HPI Here for recurrent swelling around the face. She was seen by video visit on 11-05-22 for several days of swelling around the eyes and the face, as well as a mild frontal headache. No fever or ST or cough. She had a blepharoplasty on both eyes several years ago, but they have not bothered her since until now. She generally does not wear makeup. She does not wear contact lenses. This was felt to be a cellulitis, do so she ws treated with Augmentin and Prednisone 40 mg daily for 5 days. This seemed to help and the swelling reduced a little, however now the swelling is getting worse again. There is no fever. No tongue swelling or SOB.    Review of Systems  Constitutional: Negative.   HENT:  Positive for facial swelling. Negative for congestion, ear pain, nosebleeds, postnasal drip, sinus pain and sore throat.   Eyes: Negative.   Respiratory: Negative.         Objective:   Physical Exam Constitutional:      General: She is not in acute distress.    Appearance: Normal appearance.  HENT:     Right Ear: Tympanic membrane, ear canal and external ear normal.     Left Ear: Tympanic membrane, ear canal and external ear normal.     Nose: Nose normal.     Mouth/Throat:     Pharynx: Oropharynx is clear.  Eyes:     Conjunctiva/sclera: Conjunctivae normal.     Comments: There is mild edema around both eyes and in both cheeks. No erythema or tenderness.   Cardiovascular:     Rate and Rhythm: Normal rate and regular rhythm.     Pulses: Normal pulses.     Heart sounds: Normal heart sounds.  Pulmonary:     Effort: Pulmonary effort is normal.     Breath sounds: Normal breath sounds.  Lymphadenopathy:     Cervical: No cervical adenopathy.  Neurological:     Mental Status: She is alert.           Assessment & Plan:  Facial swelling that is nore consistent with an allergic etiology  than with infection. She is currently taking Benadryl as needed, but she will start taking Zyrtec everyday. She is also started on a Prednisone taper to begin at 40 mg daily for 5 days, then 30 mg, then 20 mg, then 10 mg. Follow up as needed.  Gershon Crane, MD

## 2023-04-18 ENCOUNTER — Telehealth: Payer: BC Managed Care – PPO | Admitting: Physician Assistant

## 2023-04-18 DIAGNOSIS — R3989 Other symptoms and signs involving the genitourinary system: Secondary | ICD-10-CM

## 2023-04-18 MED ORDER — CEPHALEXIN 500 MG PO CAPS: 500.0000 mg | ORAL_CAPSULE | Freq: Two times a day (BID) | ORAL | 0 refills | Status: AC

## 2023-04-18 NOTE — Progress Notes (Signed)
I have spent 5 minutes in review of e-visit questionnaire, review and updating patient chart, medical decision making and response to patient.   Mia Milan Cody Jacklynn Dehaas, PA-C    

## 2023-04-18 NOTE — Progress Notes (Signed)

## 2023-11-18 ENCOUNTER — Telehealth: Admitting: Physician Assistant

## 2023-11-18 DIAGNOSIS — R3989 Other symptoms and signs involving the genitourinary system: Secondary | ICD-10-CM | POA: Diagnosis not present

## 2023-11-18 MED ORDER — NITROFURANTOIN MONOHYD MACRO 100 MG PO CAPS: 100.0000 mg | ORAL_CAPSULE | Freq: Two times a day (BID) | ORAL | 0 refills | Status: AC

## 2023-11-18 NOTE — Progress Notes (Signed)
